# Patient Record
Sex: Female | Born: 1950 | Race: Black or African American | Hispanic: No | Marital: Married | Smoking: Never smoker
Health system: Southern US, Community
[De-identification: ages and names within clinical notes are randomized; demographics above are authoritative.]

## PROBLEM LIST (undated history)

## (undated) DIAGNOSIS — Z8669 Personal history of other diseases of the nervous system and sense organs: Secondary | ICD-10-CM

## (undated) DIAGNOSIS — N879 Dysplasia of cervix uteri, unspecified: Secondary | ICD-10-CM

## (undated) DIAGNOSIS — E785 Hyperlipidemia, unspecified: Secondary | ICD-10-CM

## (undated) HISTORY — PX: CHOLECYSTECTOMY: SHX55

## (undated) HISTORY — PX: ABDOMINAL HYSTERECTOMY: SHX81

## (undated) HISTORY — DX: Hyperlipidemia, unspecified: E78.5

## (undated) HISTORY — DX: Personal history of other diseases of the nervous system and sense organs: Z86.69

## (undated) HISTORY — PX: COLONOSCOPY: SHX174

## (undated) HISTORY — DX: Dysplasia of cervix uteri, unspecified: N87.9

---

## 1998-04-30 ENCOUNTER — Other Ambulatory Visit: Admission: RE | Admit: 1998-04-30 | Discharge: 1998-04-30 | Payer: Self-pay | Admitting: Obstetrics and Gynecology

## 1999-01-08 ENCOUNTER — Ambulatory Visit (HOSPITAL_COMMUNITY): Admission: RE | Admit: 1999-01-08 | Discharge: 1999-01-08 | Payer: Self-pay | Admitting: Obstetrics and Gynecology

## 2001-03-07 ENCOUNTER — Observation Stay (HOSPITAL_COMMUNITY): Admission: RE | Admit: 2001-03-07 | Discharge: 2001-03-08 | Payer: Self-pay | Admitting: *Deleted

## 2001-03-07 ENCOUNTER — Encounter (INDEPENDENT_AMBULATORY_CARE_PROVIDER_SITE_OTHER): Payer: Self-pay | Admitting: Specialist

## 2002-02-28 ENCOUNTER — Other Ambulatory Visit: Admission: RE | Admit: 2002-02-28 | Discharge: 2002-02-28 | Payer: Self-pay | Admitting: Obstetrics and Gynecology

## 2002-04-07 ENCOUNTER — Emergency Department (HOSPITAL_COMMUNITY): Admission: EM | Admit: 2002-04-07 | Discharge: 2002-04-07 | Payer: Self-pay | Admitting: *Deleted

## 2003-04-30 ENCOUNTER — Other Ambulatory Visit: Admission: RE | Admit: 2003-04-30 | Discharge: 2003-04-30 | Payer: Self-pay | Admitting: Obstetrics and Gynecology

## 2005-05-20 ENCOUNTER — Encounter: Admission: RE | Admit: 2005-05-20 | Discharge: 2005-05-20 | Payer: Self-pay | Admitting: Internal Medicine

## 2006-07-19 ENCOUNTER — Ambulatory Visit: Payer: Self-pay | Admitting: Internal Medicine

## 2006-08-04 ENCOUNTER — Ambulatory Visit: Payer: Self-pay | Admitting: Internal Medicine

## 2008-12-22 ENCOUNTER — Ambulatory Visit: Payer: Self-pay | Admitting: Internal Medicine

## 2009-05-22 ENCOUNTER — Ambulatory Visit: Payer: Self-pay | Admitting: Internal Medicine

## 2009-06-04 ENCOUNTER — Ambulatory Visit: Payer: Self-pay | Admitting: Internal Medicine

## 2009-09-18 ENCOUNTER — Ambulatory Visit: Payer: Self-pay | Admitting: Internal Medicine

## 2010-03-19 ENCOUNTER — Ambulatory Visit: Payer: Self-pay | Admitting: Internal Medicine

## 2010-09-24 ENCOUNTER — Ambulatory Visit
Admission: RE | Admit: 2010-09-24 | Discharge: 2010-09-24 | Payer: Self-pay | Source: Home / Self Care | Attending: Internal Medicine | Admitting: Internal Medicine

## 2011-01-21 NOTE — Op Note (Signed)
[facility]  Patient:    Carla Collins, Carla Collins                      MRN: [id number] Proc. Date: 03/07/01 Adm. Date:  [id number] Attending:  Morene Antu                           Operative Report  PREOPERATIVE DIAGNOSES:       1. Fibroid uterus.                               2. Menorrhagia.  POSTOPERATIVE DIAGNOSES:      1. Fibroid uterus.                               2. Menorrhagia.  OPERATION:                    Laparoscopic-assisted vaginal hysterectomy.  SURGEON:                      Sherry A. Rosalio Macadamia, M.D.  ASSISTANT:                    Sung Amabile. Roslyn Smiling, M.D.  ANESTHESIA:                   General.  INDICATIONS:                  This is a 60 year old G2, P2-0-0-2, woman who has menstrual periods every month lasting 7 days.  The patient has excessively heavy bleeding, changing an overnight pad every hour with large clots.  The patient had a D&C with resectoscopic excision of submucosal fibroid in 2000; however, her bleeding improved initially but then became excessively heavy. Because of this, the patient is interested in a hysterectomy.  The patient requests that her ovaries be left in place if they are normal.  FINDINGS:                     A 12-week size uterus with multiple fibroids presents and normal tubes and ovaries.  DESCRIPTION OF PROCEDURE:     The patient was brought into the operating room and given adequate general anesthesia.  She was placed in the dorsolithotomy position.  Her abdomen and vagina were washed with Betadine.  A Foley catheter was inserted into the bladder.  The patient was draped in a sterile fashion. The subumbilical area was infiltrated with 0.25% Marcaine.  incision was made, and the fascia identified.  This was grasped with Kochers.  It was incised. Using 0 Vicryl, stay stitches were taken on either side of the fascia.  The the peritoneum identified and opened.  A Hasson trocar sleeve was placed  in the peritoneal cavity, and this was stitch down with a 0 Vicryl stay suture. Carbon dioxide was insufflated.  The laparoscope was introduced into the peritoneal cavity.  The pelvis was inspected.  It was felt an LAVH could probably be performed.  Using 1% Marcaine initially, two incisions were made mid abdomen in the lateral areas under direct visualization.  Trocars were placed within the space under direct visualization.  A small puncture of the fundal fibroid was made in placing the left trocar.  Using Kleppingers, this bleeding area was cauterized for hemostasis.  The left round ligament, using the Ligasure, was cauterized.  The left utero-ovarian ligaments were cauterized and cut using the Ligasure, performed by doing a double or triple burn prior to cutting.  The same procedure was then performed on the right. The ureters had been identified well below the area of surgery.  The cautery was taken just to above the uterine arteries.  The bladder peritoneum was identified and picked up. A small incision was made.  using the Nezhat, this area was hydroinsufflated.  The peritoneal tissues were then incised across the lower uterine segment, and the bladder was dissected down slightly with blunt dissection.  Irrigant fluid was suctioned, and the gas was turned off. The laparoscope was removed to prepare for the vaginal portion of the case.  A weighted speculum was placed in the vagina.  The cervix was grasped with two [place] tenaculums.  The cervix was infiltrated with 1% Nesacaine and 1:100,000 epinephrine.  The cervix was circumcised and vaginal tissue dissected off the cervix.  The posterior peritoneum was identified and entered sharply.  This incision was extended laterally bluntly.  A stay suture was placed between the peritoneum and vaginal mucosa for identification purposes. The uterosacral ligaments were clamped, cut, and suture ligated with 0 Vicryl ligatures.  The bladder was  developed off the lower uterine segment. Beginning cardinal ligaments were clamped and cauterized with the Ligasure and cut.  Some of these tissues were difficult to grasp with the Ligasure because they were so thin the popped out of the Ligasure.  Therefore, Heaney clamps were used.  Cardinal ligaments were clamped, cut, and suture ligated with 0 Vicryl ligatures.  Once the anterior peritoneal incision was found such that we were well below the bladder, the cardinal ligaments and uterine arteries were clamped, cut, and suture ligated with 0 Vicryl.  Some of these were cauterized with the Ligasure and cut depending on accessibility.  The last attachment of the left sidewall was identified.  It was cut after cauterizing it.  The cervix was then cut using a long knife and wedged in the middle of the uterus to remove some of the uterine tissue.  Two myomas had to be dissected free from the uterus.  The left side of the uterus was able to then be delivered using towel clips.  The right side of the uterus had a very small attachment which was clamped, cut, and cauterized.  The remainder of the uterus was able to be removed.  Some small bleeders were present.  Some of the vaginal cuff was found to be bleeding mildly.  These areas were closed with 0 Vicryl figure-of-eight stitches.  The posterior cuff was closed with 0 Vicryl in figure-of-eight stitches.  A peritoneal stitch was placed but not tied at this time.  A posterior McCall stitch was placed by taking 0 Vicryl from the uterosacral ligament across the posterior peritoneum to the right uterosacral ligament.  This was tied.  The peritoneal stitch was then tied.  Using the end of the peritoneal stitch, it was brought out inside the uterosacral ligaments just beneath the vaginal cuff.  Using a 3 needle, the same stitch was taken on the right.  These stitches were tied beneath the vaginal cuff in the midline. The vaginal cuff was then closed with  0 Vicryl figure-of-eight stitches. Adequate hemostasis was present, and attention was brought back up to the abdominal part of the procedure.  Surgeons gloves were changed, and the  patient was appropriately redraped for  this part of the procedure.  Carbon dioxide was again insufflated.  The laparoscope was replaced.  The pelvis was inspected.  There was very minimal bleeding.  These bleeding areas were cauterized using Kleppingers.  Incision was irrigated with the Nezhat and suctioned.  Pictures were obtained.  All of the fluid was attempted to be removed.  The vaginal cuff and pedicles were all inspected and felt to have adequate hemostasis.  All carbon dioxide was then allowed to escape.  The lower sleeves were removed under direct visualization.  The Hasson sleeve was removed after all carbon dioxide had escaped.  The subumbilical incision was closed with the stay sutures as well as closing in between with a running locked stitch.  The subcutaneous tissue was closed with 0 Vicryl mattress stitch including the Scarpas fascia.   The incisions were infiltrated with 0.25% Marcaine again.  The lower incisions were inspected.  The fascial incision was so small and so deep, it was unable to be closed in the lower incisions.  The skin incisions were then closed with 4-0 Maxon in subcuticular stitches.  Adequate hemostasis was present.  Band-Aids were placed over the wounds.  The patient was then taken out of the dorsolithotomy position.  She was awakened.  She was extubated.  She was moved from the operating table to a stretcher in stable condition.  Complications were none.  Estimated blood loss was less than 100 cc. DD:  03/07/01 TD:  03/07/01 Job: [id number]

## 2011-03-17 ENCOUNTER — Other Ambulatory Visit: Payer: 59 | Admitting: Internal Medicine

## 2011-03-17 DIAGNOSIS — E785 Hyperlipidemia, unspecified: Secondary | ICD-10-CM

## 2011-03-17 LAB — LIPID PANEL
Cholesterol: 222 mg/dL — ABNORMAL HIGH (ref 0–200)
HDL: 62 mg/dL (ref >39)
LDL Cholesterol: 145 mg/dL — ABNORMAL HIGH (ref 0–99)
Total CHOL/HDL Ratio: 3.6 Ratio
Triglycerides: 73 mg/dL (ref <150)
VLDL: 15 mg/dL (ref 0–40)

## 2011-03-17 LAB — HEPATIC FUNCTION PANEL
ALT: 16 U/L (ref 0–35)
AST: 23 U/L (ref 0–37)
Albumin: 4 g/dL (ref 3.5–5.2)
Alkaline Phosphatase: 85 U/L (ref 39–117)
Bilirubin, Direct: 0.1 mg/dL (ref 0.0–0.3)
Indirect Bilirubin: 0.5 mg/dL (ref 0.0–0.9)
Total Protein: 7.2 g/dL (ref 6.0–8.3)

## 2011-03-18 ENCOUNTER — Ambulatory Visit: Payer: Self-pay | Admitting: Internal Medicine

## 2011-03-18 LAB — VITAMIN D 25 HYDROXY (VIT D DEFICIENCY, FRACTURES): Vit D, 25-Hydroxy: 20 ng/mL — ABNORMAL LOW (ref 30–89)

## 2011-03-29 ENCOUNTER — Ambulatory Visit (INDEPENDENT_AMBULATORY_CARE_PROVIDER_SITE_OTHER): Payer: 59 | Admitting: Internal Medicine

## 2011-03-29 ENCOUNTER — Encounter: Payer: Self-pay | Admitting: Internal Medicine

## 2011-03-29 DIAGNOSIS — E559 Vitamin D deficiency, unspecified: Secondary | ICD-10-CM

## 2011-03-29 DIAGNOSIS — E785 Hyperlipidemia, unspecified: Secondary | ICD-10-CM

## 2011-03-29 NOTE — Progress Notes (Signed)
  Subjective:    Patient ID: Carla Collins, female    DOB: 09/01/1951, 60 y.o.   MRN: [id number]  HPI patient in today for six-month recheck on hyperlipidemia on statin therapy consisting of Zocor 40 mg daily. Also followup on vitamin D deficiency. Patient currently taking 1000 units vitamin D 3 daily. 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 Not able to exercise very much. In January total cholesterol was 173, HDL cholesterol 61 and LDL cholesterol was 100. TSH was normal at the time. Liver functions were normal.    Review of Systems     Objective:   Physical Exam neck: no thyromegaly, no carotid bruits; chest clear; cardiac exam: regular rate and rhythm normal S1 and S2; extremities without edema        Assessment & Plan:  Hyperlipidemia  Vitamin D deficiency  Plan: Continue with generic Zocor 40 mg daily. Encouraged diet exercise and weight loss. She's been traveling a great deal over the past few months and probably hasn't given dietary restriction a good trial. We are not increase the dose of Zocor this point in time despite worsening of total cholesterol and LDL. Vitamin D level is low at 20. Prescribed Drisdol 50,000 units weekly for 12 weeks followed by 2000 units vitamin D 3 daily by mouth. Schedule physical examination early February 2013

## 2011-03-29 NOTE — Patient Instructions (Signed)
Continue diet exercise and weight loss efforts. Continue same dose of Zocor 40 mg daily. Take vitamin D 50,000 units weekly for 12 weeks followed by 2000 units vitamin D 3 daily over-the-counter. See in 6 months

## 2011-10-10 ENCOUNTER — Other Ambulatory Visit: Payer: 59 | Admitting: Internal Medicine

## 2011-10-10 DIAGNOSIS — Z0000 Encounter for general adult medical examination without abnormal findings: Secondary | ICD-10-CM

## 2011-10-10 LAB — COMPREHENSIVE METABOLIC PANEL
ALT: 21 U/L (ref 0–35)
Albumin: 4 g/dL (ref 3.5–5.2)
CO2: 23 mEq/L (ref 19–32)
Calcium: 9.3 mg/dL (ref 8.4–10.5)
Chloride: 109 mEq/L (ref 96–112)
Glucose, Bld: 87 mg/dL (ref 70–99)
Potassium: 4.5 mEq/L (ref 3.5–5.3)
Sodium: 143 mEq/L (ref 135–145)
Total Bilirubin: 0.5 mg/dL (ref 0.3–1.2)
Total Protein: 7.2 g/dL (ref 6.0–8.3)

## 2011-10-10 LAB — LIPID PANEL
Cholesterol: 169 mg/dL (ref 0–200)
Triglycerides: 58 mg/dL (ref <150)
VLDL: 12 mg/dL (ref 0–40)

## 2011-10-11 ENCOUNTER — Encounter: Payer: Self-pay | Admitting: Internal Medicine

## 2011-10-11 ENCOUNTER — Ambulatory Visit (INDEPENDENT_AMBULATORY_CARE_PROVIDER_SITE_OTHER): Payer: 59 | Admitting: Internal Medicine

## 2011-10-11 VITALS — BP 108/84 | HR 84 | Temp 97.0°F | Ht 65.5 in | Wt 178.0 lb

## 2011-10-11 DIAGNOSIS — Z0000 Encounter for general adult medical examination without abnormal findings: Secondary | ICD-10-CM

## 2011-10-11 DIAGNOSIS — Z8669 Personal history of other diseases of the nervous system and sense organs: Secondary | ICD-10-CM

## 2011-10-11 DIAGNOSIS — Z87440 Personal history of urinary (tract) infections: Secondary | ICD-10-CM

## 2011-10-11 LAB — CBC WITH DIFFERENTIAL/PLATELET
Eosinophils Absolute: 0.1 10*3/uL (ref 0.0–0.7)
Hemoglobin: 14.1 g/dL (ref 12.0–15.0)
Lymphocytes Relative: 52 % — ABNORMAL HIGH (ref 12–46)
Lymphs Abs: 3.5 10*3/uL (ref 0.7–4.0)
MCH: 30.5 pg (ref 26.0–34.0)
MCV: 92.2 fL (ref 78.0–100.0)
Monocytes Relative: 6 % (ref 3–12)
Neutrophils Relative %: 41 % — ABNORMAL LOW (ref 43–77)
Platelets: 291 10*3/uL (ref 150–400)
RBC: 4.63 MIL/uL (ref 3.87–5.11)
WBC: 6.7 10*3/uL (ref 4.0–10.5)

## 2011-10-11 LAB — POCT URINALYSIS DIPSTICK
Bilirubin, UA: NEGATIVE
Glucose, UA: NEGATIVE
Nitrite, UA: NEGATIVE
Urobilinogen, UA: NEGATIVE

## 2011-10-11 LAB — VITAMIN D 25 HYDROXY (VIT D DEFICIENCY, FRACTURES): Vit D, 25-Hydroxy: 25 ng/mL — ABNORMAL LOW (ref 30–89)

## 2011-10-11 LAB — PATHOLOGIST SMEAR REVIEW

## 2011-10-24 ENCOUNTER — Ambulatory Visit (INDEPENDENT_AMBULATORY_CARE_PROVIDER_SITE_OTHER): Payer: 59 | Admitting: Internal Medicine

## 2011-10-24 ENCOUNTER — Encounter: Payer: Self-pay | Admitting: Internal Medicine

## 2011-10-24 VITALS — BP 106/76 | HR 84 | Temp 97.9°F | Wt 178.0 lb

## 2011-10-24 DIAGNOSIS — H6690 Otitis media, unspecified, unspecified ear: Secondary | ICD-10-CM

## 2011-10-24 DIAGNOSIS — J069 Acute upper respiratory infection, unspecified: Secondary | ICD-10-CM

## 2011-10-24 DIAGNOSIS — H6692 Otitis media, unspecified, left ear: Secondary | ICD-10-CM

## 2011-10-24 NOTE — Patient Instructions (Signed)
Take Biaxin 500 mg twice daily with meals for 10 days. Take Tessalon Perles 2 by mouth 3 times daily as needed for cough. Drink plenty of fluids and get plenty of rest. Call if not better in one week.

## 2011-10-24 NOTE — Progress Notes (Signed)
  Subjective:    Patient ID: Carla Collins, female    DOB: Apr 25, 1951, 61 y.o.   MRN: [id number]  HPI patient in today complaining of URI symptoms and had onset on February 13. Came down with runny nose, scratchy throat and subsequently developed ear pain over the weekend. Specifically has acute pain left ear. No fever or shaking chills. Cannot hear well out of a year.    Review of Systems     Objective:   Physical Exam HEENT exam: Patient has fullness right TM with good light reflex. Left TM is fiery red and dull. Pharynx slightly injected. Neck is supple without significant adenopathy. Chest clear.        Assessment & Plan:  URI  Acute left otitis media  Plan: Biaxin 500 mg by mouth twice daily for 10 days. Tessalon Perles 100 mg (#D.) 2 by mouth 3 times a day when necessary cough.

## 2011-11-07 ENCOUNTER — Telehealth: Payer: Self-pay | Admitting: Internal Medicine

## 2011-11-07 MED ORDER — HYDROCODONE-HOMATROPINE 5-1.5 MG/5ML PO SYRP: 5.0000 mL | ORAL_SOLUTION | Freq: Three times a day (TID) | ORAL | Status: AC | PRN

## 2011-11-07 NOTE — Telephone Encounter (Signed)
Call in Hycodan Syrup 8 oz  One tsp every 6 hors as needed for cough

## 2011-12-05 DIAGNOSIS — Z8669 Personal history of other diseases of the nervous system and sense organs: Secondary | ICD-10-CM | POA: Insufficient documentation

## 2011-12-05 DIAGNOSIS — Z87440 Personal history of urinary (tract) infections: Secondary | ICD-10-CM | POA: Insufficient documentation

## 2011-12-05 NOTE — Progress Notes (Signed)
  Subjective:    Patient ID: Carla Collins, female    DOB: 1950/09/19, 61 y.o.   MRN: [id number]  HPI pleasant 61 year old black female with history of hyperlipidemia for health maintenance exam. History of migraine headaches treated by Dr. Marcelyn Ditty and 1989. History of cervical dysplasia 1992 with cryotherapy. History of urticaria 2008. History of vitamin D deficiency. Sulfa causes a rash. Has had recurrent urinary tract infections from time to time. Hyperlipidemia well controlled with simvastatin 40 mg daily. Patient had a D&C in 2000. Has had a hysterectomy. Had colonoscopy 2007 by Dr. Juanda Chance with 10 year followup recommended.  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 She is married. Has a Masters degree. Does not smoke or consume alcohol. Has one daughter.  Family history: Father with history of MI. Mother with history of hypertension. One sister alive and well.  GYN is [employer]    Review of Systems noncontributory      Objective:   Physical Exam HEENT exam: TMs and pharynx are clear; extraocular movements are full; pharynx is clear; neck is supple without JVD thyromegaly or carotid bruits. Chest is clear to auscultation. Breasts normal female without masses. Cardiac exam regular rate and rhythm normal S1 and S2. Abdomen no hepatosplenomegaly masses or tenderness. GYN exam is deferred to GYN physician. Extremities without deformity or edema. Neuro no gross focal deficits on brief neurological exam.        Assessment & Plan:  Hyperlipidemia  History of vitamin D deficiency   History of recurrent urinary tract infections  Remote history of migraine headaches  Plan: Return in 6 months for office visit lipid panel liver functions. Encouraged diet and exercise.

## 2011-12-05 NOTE — Patient Instructions (Signed)
Continue same medications and return in 6 months 

## 2012-01-19 ENCOUNTER — Other Ambulatory Visit: Payer: Self-pay | Admitting: Internal Medicine

## 2012-01-20 ENCOUNTER — Other Ambulatory Visit: Payer: Self-pay

## 2012-01-20 MED ORDER — SIMVASTATIN 40 MG PO TABS: 40.0000 mg | ORAL_TABLET | Freq: Every day | ORAL | Status: DC

## 2012-01-26 ENCOUNTER — Ambulatory Visit (INDEPENDENT_AMBULATORY_CARE_PROVIDER_SITE_OTHER): Payer: 59 | Admitting: Internal Medicine

## 2012-01-26 ENCOUNTER — Encounter: Payer: Self-pay | Admitting: Internal Medicine

## 2012-01-26 VITALS — BP 120/84 | HR 84 | Temp 96.4°F | Wt 182.0 lb

## 2012-01-26 DIAGNOSIS — E785 Hyperlipidemia, unspecified: Secondary | ICD-10-CM

## 2012-01-26 DIAGNOSIS — N3941 Urge incontinence: Secondary | ICD-10-CM

## 2012-01-26 DIAGNOSIS — N393 Stress incontinence (female) (male): Secondary | ICD-10-CM

## 2012-01-26 DIAGNOSIS — J069 Acute upper respiratory infection, unspecified: Secondary | ICD-10-CM

## 2012-01-26 DIAGNOSIS — K529 Noninfective gastroenteritis and colitis, unspecified: Secondary | ICD-10-CM

## 2012-01-26 DIAGNOSIS — K5289 Other specified noninfective gastroenteritis and colitis: Secondary | ICD-10-CM

## 2012-01-26 NOTE — Patient Instructions (Signed)
Take Levaquin 500 milligrams daily for 7 days. Hycodan 8 ounces prescribed to take 1 teaspoon every 6 hours as needed for cough. Try Toviaz 4 mg daily for urinary incontinence issues

## 2012-01-26 NOTE — Progress Notes (Signed)
  Subjective:    Patient ID: Carla Collins, female    DOB: 1951/01/09, 61 y.o.   MRN: [id number]  HPI Pleasant 61 year old black female with history of hyperlipidemia on lipid lowering medication in today with URI symptoms. On Monday, May 20 she had a 24-hour bout of gastroenteritis with vomiting and diarrhea. That resolved and she subsequently developed a respiratory infection. Has been coughing a great deal. Cough sounds somewhat congested. No fever or shaking chills. Cough history GERD stress urinary incontinence. This is been very disconcerting to her. Also has had some urge urinary incontinence in the past. No sore throat- just cough and sinus congestion.    Review of Systems     Objective:   Physical Exam HEENT exam: TMs are clear; she sounds nasally congested when she speaks; chest clear to auscultation; abdomen is benign.        Assessment & Plan:  Upper respiratory infection  Gastroenteritis-resolved  Stress urinary incontinence  History of urge urinary incontinence  Hyperlipidemia  Plan: Samples of Toviaz 4 mg daily with prescription for same with refills. Can increase to 8 mg if necessary. Levaquin 500 milligrams daily for 7 days for respiratory infection. Hycodan 8 ounces 1 teaspoon by mouth Q6 hours when necessary cough

## 2012-04-10 ENCOUNTER — Other Ambulatory Visit: Payer: 59 | Admitting: Internal Medicine

## 2012-04-12 ENCOUNTER — Ambulatory Visit (INDEPENDENT_AMBULATORY_CARE_PROVIDER_SITE_OTHER): Payer: 59 | Admitting: Internal Medicine

## 2012-04-12 ENCOUNTER — Encounter: Payer: Self-pay | Admitting: Internal Medicine

## 2012-04-12 ENCOUNTER — Other Ambulatory Visit: Payer: 59 | Admitting: Internal Medicine

## 2012-04-12 VITALS — BP 102/78 | HR 76 | Temp 97.6°F | Wt 183.5 lb

## 2012-04-12 DIAGNOSIS — E785 Hyperlipidemia, unspecified: Secondary | ICD-10-CM

## 2012-04-12 DIAGNOSIS — E669 Obesity, unspecified: Secondary | ICD-10-CM

## 2012-04-12 DIAGNOSIS — Z79899 Other long term (current) drug therapy: Secondary | ICD-10-CM

## 2012-04-12 LAB — LIPID PANEL
HDL: 63 mg/dL (ref >39)
LDL Cholesterol: 122 mg/dL — ABNORMAL HIGH (ref 0–99)
Triglycerides: 70 mg/dL (ref <150)

## 2012-04-12 LAB — HEPATIC FUNCTION PANEL
AST: 19 U/L (ref 0–37)
Albumin: 3.9 g/dL (ref 3.5–5.2)
Total Bilirubin: 0.6 mg/dL (ref 0.3–1.2)
Total Protein: 7.4 g/dL (ref 6.0–8.3)

## 2012-04-12 NOTE — Patient Instructions (Addendum)
Continue same meds and return in 6 months. Watch diet and lose weight.

## 2012-05-07 NOTE — Progress Notes (Signed)
  Subjective:    Patient ID: Carla Collins, female    DOB: [birth year], 61 y.o.   MRN: [id number]  HPI 61 year old black female in today for followup on hyperlipidemia. This is a six-month recheck. No complaints or problems. Fasting lipid panel and liver functions drawn recently. Total cholesterol 199 with an LDL cholesterol of 122 despite being on Zocor 40 mg daily. A year ago total cholesterol was 222. Patient doesn't really exercise. She works for [employer] and travels a lot and is in a lot of meetings sitting. She is overweight and needs to lose some weight and follow a more strict low-fat diet.    Review of Systems     Objective:   Physical Exam neck is supple without JVD thyromegaly or carotid bruits; chest clear to auscultation; cardiac exam regular rate and rhythm normal S1 and S2. Extremities without edema, skin is warm and dry.        Assessment & Plan:  Hyperlipidemia  Obesity  Plan: Patient encouraged to try harder with diet exercise and weight loss plan. Reassess in 6 months at which time she'll be due for physical exam.

## 2012-06-25 ENCOUNTER — Other Ambulatory Visit: Payer: Self-pay

## 2012-06-25 MED ORDER — SIMVASTATIN 40 MG PO TABS: 40.0000 mg | ORAL_TABLET | Freq: Every day | ORAL | Status: DC

## 2012-11-05 ENCOUNTER — Other Ambulatory Visit: Payer: 59 | Admitting: Internal Medicine

## 2012-11-05 DIAGNOSIS — E559 Vitamin D deficiency, unspecified: Secondary | ICD-10-CM

## 2012-11-05 DIAGNOSIS — E785 Hyperlipidemia, unspecified: Secondary | ICD-10-CM

## 2012-11-05 DIAGNOSIS — Z0000 Encounter for general adult medical examination without abnormal findings: Secondary | ICD-10-CM

## 2012-11-05 LAB — COMPREHENSIVE METABOLIC PANEL
ALT: 13 U/L (ref 0–35)
AST: 19 U/L (ref 0–37)
Albumin: 4.1 g/dL (ref 3.5–5.2)
Alkaline Phosphatase: 72 U/L (ref 39–117)
BUN: 14 mg/dL (ref 6–23)
Calcium: 9.5 mg/dL (ref 8.4–10.5)
Chloride: 107 mEq/L (ref 96–112)
Potassium: 4.2 mEq/L (ref 3.5–5.3)
Sodium: 139 mEq/L (ref 135–145)
Total Protein: 7.3 g/dL (ref 6.0–8.3)

## 2012-11-05 LAB — CBC WITH DIFFERENTIAL/PLATELET
Basophils Absolute: 0 10*3/uL (ref 0.0–0.1)
Basophils Relative: 1 % (ref 0–1)
HCT: 41.9 % (ref 36.0–46.0)
Hemoglobin: 14.3 g/dL (ref 12.0–15.0)
Lymphocytes Relative: 57 % — ABNORMAL HIGH (ref 12–46)
Monocytes Absolute: 0.4 10*3/uL (ref 0.1–1.0)
Neutro Abs: 2 10*3/uL (ref 1.7–7.7)
Neutrophils Relative %: 34 % — ABNORMAL LOW (ref 43–77)
RDW: 14.2 % (ref 11.5–15.5)
WBC: 5.7 10*3/uL (ref 4.0–10.5)

## 2012-11-05 LAB — LIPID PANEL: LDL Cholesterol: 190 mg/dL — ABNORMAL HIGH (ref 0–99)

## 2012-11-05 LAB — TSH: TSH: 3.736 u[IU]/mL (ref 0.350–4.500)

## 2012-11-06 ENCOUNTER — Encounter: Payer: Self-pay | Admitting: Internal Medicine

## 2012-11-06 ENCOUNTER — Encounter: Payer: 59 | Admitting: Internal Medicine

## 2012-11-06 LAB — VITAMIN D 25 HYDROXY (VIT D DEFICIENCY, FRACTURES): Vit D, 25-Hydroxy: 26 ng/mL — ABNORMAL LOW (ref 30–89)

## 2012-12-18 ENCOUNTER — Encounter: Payer: 59 | Admitting: Internal Medicine

## 2013-02-11 ENCOUNTER — Other Ambulatory Visit: Payer: Self-pay | Admitting: Internal Medicine

## 2013-02-11 ENCOUNTER — Ambulatory Visit (INDEPENDENT_AMBULATORY_CARE_PROVIDER_SITE_OTHER): Payer: 59 | Admitting: Internal Medicine

## 2013-02-11 ENCOUNTER — Encounter: Payer: Self-pay | Admitting: Internal Medicine

## 2013-02-11 VITALS — BP 122/84 | HR 84 | Temp 96.8°F | Wt 186.0 lb

## 2013-02-11 DIAGNOSIS — E559 Vitamin D deficiency, unspecified: Secondary | ICD-10-CM

## 2013-02-11 DIAGNOSIS — Z789 Other specified health status: Secondary | ICD-10-CM | POA: Insufficient documentation

## 2013-02-11 DIAGNOSIS — N390 Urinary tract infection, site not specified: Secondary | ICD-10-CM

## 2013-02-11 DIAGNOSIS — E785 Hyperlipidemia, unspecified: Secondary | ICD-10-CM

## 2013-02-11 DIAGNOSIS — M722 Plantar fascial fibromatosis: Secondary | ICD-10-CM

## 2013-02-11 DIAGNOSIS — Z803 Family history of malignant neoplasm of breast: Secondary | ICD-10-CM

## 2013-02-11 NOTE — Progress Notes (Signed)
Subjective:    Patient ID: Carla Collins, female    DOB: 1951-05-30, 62 y.o.   MRN: [id number]  HPI 62 year old Black female in today for health maintenance and evaluation of medical problems. History of vitamin D deficiency and hyperlipidemia. Mother was recently diagnosed with breast cancer at age 64. She has lots of questions about this. Several of mother sisters also have breast cancer. Mother will be seeing oncologist soon and patient should go with mother to that visit and discuss whether or not she needs to be tested for BRCA gene. Fasting labs were reviewed. She is off simvastatin because she was having myalgias with it. She also now has persistent pain in her feet which she relates to the statins but she's been off statins for several months. I do think she probably has plantar fasciitis because of complaint of heel pain. Recommended she see podiatrist. Cholesterol is markedly elevated off statin medication. Specifically elevated his total cholesterol in the 250 range and LDL cholesterol. TSH is normal. Vitamin D level is low and she's only taking 1000 units vitamin D 3 daily. Needs to increase that to 2000 units daily.  Aside from mother being recently diagnosed with breast cancer at age 61, there is no change in family history. Father with history of MI. Mother with history of hypertension. One sister alive and well.  Social history: Patient is married. 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 Does not smoke or consume alcohol. Has one daughter.  History migraine headaches. History of cervical dysplasia 1992 with cryotherapy. History of vitamin D deficiency. History of urticaria 2008. Has had recurrent urinary tract infections from time to time. Patient had D&C in 2000. Has had hysterectomy. Had colonoscopy in 2007 with 10 year followup recommended.  [facility] dose GYN exam.      Review of Systems  Constitutional: Negative.   Eyes: Negative.    Respiratory: Negative.   Cardiovascular: Negative.   Gastrointestinal: Negative.   Endocrine: Negative.   Genitourinary: Negative.   Allergic/Immunologic: Negative.   Neurological: Negative.   Hematological: Negative.   Psychiatric/Behavioral: Negative.        Objective:   Physical Exam  Constitutional: She is oriented to person, place, and time. She appears well-developed and well-nourished. No distress.  HENT:  Head: Normocephalic and atraumatic.  Right Ear: External ear normal.  Left Ear: External ear normal.  Mouth/Throat: No oropharyngeal exudate.  Eyes: Conjunctivae and EOM are normal. Pupils are equal, round, and reactive to light. Right eye exhibits no discharge. Left eye exhibits no discharge. No scleral icterus.  Neck: Neck supple. No JVD present. No thyromegaly present.  Cardiovascular: Normal rate, regular rhythm, normal heart sounds and intact distal pulses.   No murmur heard. Regular rate and rhythm normal S1 and S2 without murmur  Pulmonary/Chest: Effort normal and breath sounds normal. No respiratory distress. She has no wheezes. She has no rales.  Breasts normal female without masses  Abdominal: Soft. Bowel sounds are normal. She exhibits no distension and no mass. There is no tenderness. There is no rebound and no guarding.  Genitourinary:  Deferred to GYN  Musculoskeletal: She exhibits no edema.  Lymphadenopathy:    She has no cervical adenopathy.  Neurological: She is alert and oriented to person, place, and time. She has normal reflexes. No cranial nerve deficit. Coordination normal.  Skin: Skin is dry. No rash noted. She is not diaphoretic. No erythema.  Psychiatric: She has a normal mood  and affect. Her behavior is normal. Judgment and thought content normal.          Assessment & Plan:  Plantar fasciitis-treated with anti-inflammatory medication. Shown foot exercises to stretch fascia  Myalgias on simvastatin-try Crestor 10 mg daily. Return in 3  months for lipid panel, liver functions without office visit if tolerated  Urinary tract infection-treat with Cipro 500 mg twice daily for 7 days. Culture is pending  Hyperlipidemia-start Crestor 10 mg daily and return in 3 months for lipid panel liver functions without office visit if tolerated  Family history of breast cancer

## 2013-02-11 NOTE — Patient Instructions (Addendum)
Discontinue simvastatin. Try Crestor 10 mg daily. Return in 3 months for lipid panel and liver functions with office visit. For urinary tract infection take Cipro 500 mg twice daily for 7 days. Urine culture is pending. Take vitamin D 2000 units daily instead of 1000 units daily. Discussed genetic testing for breast cancer with oncologist.

## 2013-02-13 LAB — URINE CULTURE: Colony Count: 60000

## 2013-06-14 ENCOUNTER — Other Ambulatory Visit: Payer: Self-pay | Admitting: Internal Medicine

## 2013-08-13 ENCOUNTER — Other Ambulatory Visit: Payer: 59 | Admitting: Internal Medicine

## 2013-08-13 DIAGNOSIS — Z79899 Other long term (current) drug therapy: Secondary | ICD-10-CM

## 2013-08-13 DIAGNOSIS — E785 Hyperlipidemia, unspecified: Secondary | ICD-10-CM

## 2013-08-13 LAB — HEPATIC FUNCTION PANEL
ALT: 21 U/L (ref 0–35)
Alkaline Phosphatase: 95 U/L (ref 39–117)
Bilirubin, Direct: 0.1 mg/dL (ref 0.0–0.3)
Indirect Bilirubin: 0.5 mg/dL (ref 0.0–0.9)

## 2013-08-13 LAB — LIPID PANEL
LDL Cholesterol: 99 mg/dL (ref 0–99)
Triglycerides: 57 mg/dL (ref <150)
VLDL: 11 mg/dL (ref 0–40)

## 2013-08-15 ENCOUNTER — Telehealth: Payer: Self-pay | Admitting: Internal Medicine

## 2013-08-15 NOTE — Telephone Encounter (Signed)
Noted  

## 2013-08-19 ENCOUNTER — Other Ambulatory Visit: Payer: Self-pay | Admitting: *Deleted

## 2013-08-20 ENCOUNTER — Other Ambulatory Visit: Payer: Self-pay | Admitting: *Deleted

## 2013-12-30 ENCOUNTER — Encounter: Payer: Self-pay | Admitting: Internal Medicine

## 2013-12-30 ENCOUNTER — Ambulatory Visit (INDEPENDENT_AMBULATORY_CARE_PROVIDER_SITE_OTHER): Payer: 59 | Admitting: Internal Medicine

## 2013-12-30 VITALS — BP 118/90 | HR 84 | Temp 98.4°F | Wt 193.0 lb

## 2013-12-30 DIAGNOSIS — J069 Acute upper respiratory infection, unspecified: Secondary | ICD-10-CM

## 2013-12-30 DIAGNOSIS — H6593 Unspecified nonsuppurative otitis media, bilateral: Secondary | ICD-10-CM

## 2013-12-30 DIAGNOSIS — G4700 Insomnia, unspecified: Secondary | ICD-10-CM

## 2013-12-30 DIAGNOSIS — H6590 Unspecified nonsuppurative otitis media, unspecified ear: Secondary | ICD-10-CM

## 2013-12-30 DIAGNOSIS — R635 Abnormal weight gain: Secondary | ICD-10-CM

## 2013-12-30 MED ORDER — AZITHROMYCIN 250 MG PO TABS: ORAL_TABLET | ORAL | Status: DC

## 2013-12-30 MED ORDER — CLONAZEPAM 0.5 MG PO TABS: 0.5000 mg | ORAL_TABLET | Freq: Every evening | ORAL | Status: DC | PRN

## 2013-12-30 NOTE — Patient Instructions (Signed)
Take phentermine daily for weight loss for 90 days. Take Zithromax Z-PAK for respiratory infection. Take Klonopin 0.5 mg at bedtime for sleep

## 2013-12-30 NOTE — Progress Notes (Signed)
   Subjective:    Patient ID: Carla Collins, female    DOB: 1951/09/05, 63 y.o.   MRN: [id number]  HPI Patient has gained 7-1/2 pounds since June 2014. She is driving either to [place]  or [place] at least 4 days a week. She gets up fairly and returns home late. Really hasn't had time to exercise. With regard to diet, she is eating very healthy and low-calorie foods. Went over her diet history which sounds very reasonable. Doesn't exercise on the weekends either. Says she is built like her father. Also think she's coming down with a restaurant or infection. Complains of sore throat. Also tells me today she has a long-standing history of insomnia not sleeping well at night. Says she's not anxious about her job. Hopes to retire in another year or so. Apparently insomnia is long-standing and she doesn't understand why she has it.    Review of Systems     Objective:   Physical Exam both TMs are full bilaterally but not read. Pharynx. Slightly injected without exudate. Neck supple. Chest clear. Went over diet history at length. Spent 25 minutes with patient going overall of these issues.        Assessment & Plan:  Acute bilateral serous otitis media  Acute URI  Insomnia  Weight gain-check thyroid functions  Plan: It is my opinion that a little bit of exercise would help prevent this weight gain but she needs to be committed to it. It's difficult because she travels long distances daily with her work. I gave her phentermine 37.5 mg #90 one by mouth daily. Prescription will not be refilled. This will give her a jump start on diet exercise. Needs to exercise at least 3 times weekly. Thyroid functions are pending. For insomnia prescribed Klonopin 0.5 mg at bedtime. If she is sleeping better, perhaps she'll feel more like exercising. Continue to watch diet and eat healthy.

## 2013-12-31 LAB — T4, FREE: Free T4: 0.87 ng/dL (ref 0.80–1.80)

## 2013-12-31 LAB — HEMOGLOBIN A1C
Hgb A1c MFr Bld: 5.9 % — ABNORMAL HIGH (ref <5.7)
MEAN PLASMA GLUCOSE: 123 mg/dL — AB (ref <117)

## 2013-12-31 LAB — TSH: TSH: 1.412 u[IU]/mL (ref 0.350–4.500)

## 2014-02-17 ENCOUNTER — Other Ambulatory Visit: Payer: 59 | Admitting: Internal Medicine

## 2014-02-20 ENCOUNTER — Encounter: Payer: 59 | Admitting: Internal Medicine

## 2014-03-28 ENCOUNTER — Other Ambulatory Visit: Payer: 59 | Admitting: Internal Medicine

## 2014-03-31 ENCOUNTER — Encounter: Payer: 59 | Admitting: Internal Medicine

## 2014-07-08 ENCOUNTER — Encounter: Payer: Self-pay | Admitting: Internal Medicine

## 2014-07-08 ENCOUNTER — Ambulatory Visit (INDEPENDENT_AMBULATORY_CARE_PROVIDER_SITE_OTHER): Payer: 59 | Admitting: Internal Medicine

## 2014-07-08 VITALS — BP 140/88 | HR 89 | Temp 97.7°F | Wt 193.0 lb

## 2014-07-08 DIAGNOSIS — J069 Acute upper respiratory infection, unspecified: Secondary | ICD-10-CM

## 2014-07-08 MED ORDER — HYDROCODONE-HOMATROPINE 5-1.5 MG/5ML PO SYRP
5.0000 mL | ORAL_SOLUTION | Freq: Three times a day (TID) | ORAL | Status: DC | PRN
Start: 2014-07-08 — End: 2015-04-17

## 2014-07-08 MED ORDER — CLARITHROMYCIN 500 MG PO TABS: 500.0000 mg | ORAL_TABLET | Freq: Two times a day (BID) | ORAL | Status: DC

## 2014-07-08 NOTE — Progress Notes (Signed)
   Subjective:    Patient ID: Carla Collins, female    DOB: 08-16-1951, 63 y.o.   MRN: [id number]  HPI  Patient in today with acute URI symptoms. Has white sputum production. No fever or shaking chills. Some nasal congestion. Has malaise and fatigue.  Father died of myeloma    Review of Systems     Objective:   Physical Exam  Skin warm and dry. No cervical adenopathy. Neck is supple. Chest clear to auscultation. TMs and pharynx clear.      Assessment & Plan:  Acute URI  Plan: Biaxin 500 mg twice daily for 10 days. Hycodan syrup 1 teaspoon by mouth every 8 hours when necessary cough. Call if not better in 7-10 days or sooner if worse.

## 2014-08-01 ENCOUNTER — Other Ambulatory Visit: Payer: Self-pay | Admitting: Internal Medicine

## 2014-10-11 ENCOUNTER — Encounter: Payer: Self-pay | Admitting: Internal Medicine

## 2014-10-11 NOTE — Patient Instructions (Signed)
Take Biaxin as directed. Take Hycodan sparingly for cough. Call if not better in 7-10 days or sooner if worse.

## 2015-04-09 ENCOUNTER — Other Ambulatory Visit: Payer: 59 | Admitting: Internal Medicine

## 2015-04-09 DIAGNOSIS — Z1321 Encounter for screening for nutritional disorder: Secondary | ICD-10-CM

## 2015-04-09 DIAGNOSIS — Z13220 Encounter for screening for lipoid disorders: Secondary | ICD-10-CM

## 2015-04-09 DIAGNOSIS — Z130 Encounter for screening for diseases of the blood and blood-forming organs and certain disorders involving the immune mechanism: Secondary | ICD-10-CM

## 2015-04-09 DIAGNOSIS — Z0000 Encounter for general adult medical examination without abnormal findings: Secondary | ICD-10-CM

## 2015-04-09 DIAGNOSIS — Z1329 Encounter for screening for other suspected endocrine disorder: Secondary | ICD-10-CM

## 2015-04-09 LAB — CBC WITH DIFFERENTIAL/PLATELET
Basophils Absolute: 0 10*3/uL (ref 0.0–0.1)
Basophils Relative: 0 % (ref 0–1)
EOS ABS: 0.1 10*3/uL (ref 0.0–0.7)
Eosinophils Relative: 1 % (ref 0–5)
HCT: 42.5 % (ref 36.0–46.0)
HEMOGLOBIN: 14.4 g/dL (ref 12.0–15.0)
Lymphocytes Relative: 56 % — ABNORMAL HIGH (ref 12–46)
Lymphs Abs: 3.5 10*3/uL (ref 0.7–4.0)
MCH: 30.6 pg (ref 26.0–34.0)
MCHC: 33.9 g/dL (ref 30.0–36.0)
MCV: 90.2 fL (ref 78.0–100.0)
MONO ABS: 0.4 10*3/uL (ref 0.1–1.0)
MPV: 10.5 fL (ref 8.6–12.4)
Monocytes Relative: 6 % (ref 3–12)
Neutro Abs: 2.3 10*3/uL (ref 1.7–7.7)
Neutrophils Relative %: 37 % — ABNORMAL LOW (ref 43–77)
Platelets: 261 10*3/uL (ref 150–400)
RBC: 4.71 MIL/uL (ref 3.87–5.11)
RDW: 14.1 % (ref 11.5–15.5)
WBC: 6.2 10*3/uL (ref 4.0–10.5)

## 2015-04-09 LAB — COMPLETE METABOLIC PANEL WITH GFR
ALBUMIN: 3.8 g/dL (ref 3.6–5.1)
ALT: 17 U/L (ref 6–29)
AST: 22 U/L (ref 10–35)
Alkaline Phosphatase: 88 U/L (ref 33–130)
BUN: 20 mg/dL (ref 7–25)
CO2: 24 mmol/L (ref 20–31)
CREATININE: 0.87 mg/dL (ref 0.50–0.99)
Calcium: 9 mg/dL (ref 8.6–10.4)
Chloride: 108 mmol/L (ref 98–110)
GFR, EST AFRICAN AMERICAN: 83 mL/min (ref >=60)
GFR, Est Non African American: 72 mL/min (ref >=60)
Glucose, Bld: 79 mg/dL (ref 65–99)
POTASSIUM: 4.6 mmol/L (ref 3.5–5.3)
SODIUM: 141 mmol/L (ref 135–146)
Total Bilirubin: 0.6 mg/dL (ref 0.2–1.2)
Total Protein: 7.1 g/dL (ref 6.1–8.1)

## 2015-04-09 LAB — LIPID PANEL
Cholesterol: 185 mg/dL (ref 125–200)
HDL: 63 mg/dL (ref >=46)
LDL CALC: 109 mg/dL (ref <130)
TRIGLYCERIDES: 67 mg/dL (ref <150)
Total CHOL/HDL Ratio: 2.9 Ratio (ref <=5.0)
VLDL: 13 mg/dL (ref <30)

## 2015-04-09 LAB — TSH: TSH: 3.125 u[IU]/mL (ref 0.350–4.500)

## 2015-04-10 LAB — VITAMIN D 25 HYDROXY (VIT D DEFICIENCY, FRACTURES): VIT D 25 HYDROXY: 27 ng/mL — AB (ref 30–100)

## 2015-04-13 ENCOUNTER — Encounter: Payer: Self-pay | Admitting: Internal Medicine

## 2015-04-17 ENCOUNTER — Encounter: Payer: Self-pay | Admitting: Internal Medicine

## 2015-04-17 ENCOUNTER — Ambulatory Visit (INDEPENDENT_AMBULATORY_CARE_PROVIDER_SITE_OTHER): Payer: 59 | Admitting: Internal Medicine

## 2015-04-17 VITALS — BP 108/82 | HR 89 | Temp 97.9°F | Ht 66.0 in | Wt 191.0 lb

## 2015-04-17 DIAGNOSIS — Z8669 Personal history of other diseases of the nervous system and sense organs: Secondary | ICD-10-CM | POA: Diagnosis not present

## 2015-04-17 DIAGNOSIS — Z0000 Encounter for general adult medical examination without abnormal findings: Secondary | ICD-10-CM

## 2015-04-17 DIAGNOSIS — Z820 Family history of epilepsy and other diseases of the nervous system: Secondary | ICD-10-CM

## 2015-04-17 DIAGNOSIS — Z8639 Personal history of other endocrine, nutritional and metabolic disease: Secondary | ICD-10-CM | POA: Diagnosis not present

## 2015-04-17 DIAGNOSIS — E785 Hyperlipidemia, unspecified: Secondary | ICD-10-CM | POA: Diagnosis not present

## 2015-04-17 DIAGNOSIS — R8290 Unspecified abnormal findings in urine: Secondary | ICD-10-CM | POA: Diagnosis not present

## 2015-04-17 DIAGNOSIS — Z803 Family history of malignant neoplasm of breast: Secondary | ICD-10-CM

## 2015-04-17 DIAGNOSIS — Z818 Family history of other mental and behavioral disorders: Secondary | ICD-10-CM

## 2015-04-17 DIAGNOSIS — Z23 Encounter for immunization: Secondary | ICD-10-CM

## 2015-04-17 LAB — POCT URINALYSIS DIPSTICK
Bilirubin, UA: NEGATIVE
Glucose, UA: NEGATIVE
Ketones, UA: NEGATIVE
Nitrite, UA: NEGATIVE
PROTEIN UA: NEGATIVE
Spec Grav, UA: 1.02
UROBILINOGEN UA: NEGATIVE
pH, UA: 5

## 2015-04-17 MED ORDER — ROSUVASTATIN CALCIUM 10 MG PO TABS: ORAL_TABLET | ORAL | Status: DC

## 2015-04-17 MED ORDER — CLONAZEPAM 0.5 MG PO TABS: 0.5000 mg | ORAL_TABLET | Freq: Every day | ORAL | Status: DC

## 2015-04-17 NOTE — Progress Notes (Signed)
   Subjective:    Patient ID: Carla Collins, female    DOB: 1951/07/22, 64 y.o.   MRN: [id number]  HPI 64 year old Black Female in today for health maintenance exam and evaluation of medical issues including hyperlipidemia treated with statin.   She has a history of vitamin D deficiency. History of migraine headaches. 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 History of urticaria 2008. Has had recurrent urinary tract infections from time to time. Patient had D&C in 2000. Has had hysterectomy.   Had colonoscopy in 2007 with 10 year follow-up recommended.  Social history: She is married. Has a Masters degree. 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  Does not smoke or consume alcohol. One daughter.  Family history: Mother diagnosed with breast cancer at age 46 still living with history of dementia and hypertension. Father with history of MI.  One sister alive and well.  [facility] does GYN exam.      Review of Systems  Constitutional: Negative.   All other systems reviewed and are negative.      Objective:   Physical Exam  Constitutional: She is oriented to person, place, and time. She appears well-developed and well-nourished. No distress.  HENT:  Head: Normocephalic and atraumatic.  Right Ear: External ear normal.  Left Ear: External ear normal.  Mouth/Throat: Oropharynx is clear and moist. No oropharyngeal exudate.  Eyes: Conjunctivae and EOM are normal. Pupils are equal, round, and reactive to light. Right eye exhibits no discharge. No scleral icterus.  Neck: Neck supple. No JVD present. No thyromegaly present.  Cardiovascular: Normal rate, normal heart sounds and intact distal pulses.   No murmur heard. Pulmonary/Chest: Effort normal and breath sounds normal. She has no wheezes. She has no rales.  Breasts normal female  Abdominal: Soft. Bowel sounds are normal. She exhibits no distension and no mass. There is no tenderness.  There is no rebound and no guarding.  Genitourinary:  Deferred to GYN  Musculoskeletal: She exhibits no edema.  Lymphadenopathy:    She has no cervical adenopathy.  Neurological: She is alert and oriented to person, place, and time. She has normal reflexes. No cranial nerve deficit. Coordination normal.  Skin: Skin is warm and dry. No rash noted. She is not diaphoretic.  Psychiatric: She has a normal mood and affect. Her behavior is normal. Judgment and thought content normal.  Vitals reviewed.         Assessment & Plan:  Hyperlipidemia-stable on statin medication. Liver functions normal.  History of vitamin D deficiency-vitamin D level low at 27. Recommend daily 2000 units vitamin D 3.  History of migraine headaches  Family history of breast cancer in mother-patient had mammogram July 2016 which was normal  Family history of dementia in mother  Plan: Continue same medications and return in one year or as needed. Have annual mammogram. Last mammogram done at [facility] July 2016 was normal. Colonoscopy due 2017. Dipstick UA abnormal. Urine culture pending.  Addendum: Urine culture -insignificant growth-- no treatment needed

## 2015-04-18 LAB — URINE CULTURE: Colony Count: 45000

## 2015-04-20 ENCOUNTER — Telehealth: Payer: Self-pay | Admitting: *Deleted

## 2015-04-20 NOTE — Telephone Encounter (Signed)
Left message with urine culture results on patient voice mail.

## 2015-08-02 DIAGNOSIS — Z818 Family history of other mental and behavioral disorders: Secondary | ICD-10-CM | POA: Insufficient documentation

## 2015-08-02 NOTE — Patient Instructions (Addendum)
It was pleasure to see you today. Continue same medications and return in one year or as needed. Urine culture pending. Take 2000 units of vitamin D3 daily. Have annual mammogram. Colonoscopy due 2017.

## 2015-10-20 ENCOUNTER — Other Ambulatory Visit: Payer: 59 | Admitting: Internal Medicine

## 2015-10-20 DIAGNOSIS — Z79899 Other long term (current) drug therapy: Secondary | ICD-10-CM

## 2015-10-20 DIAGNOSIS — E785 Hyperlipidemia, unspecified: Secondary | ICD-10-CM

## 2015-10-20 LAB — LIPID PANEL
Cholesterol: 170 mg/dL (ref 125–200)
HDL: 60 mg/dL (ref >=46)
LDL CALC: 98 mg/dL (ref <130)
Total CHOL/HDL Ratio: 2.8 Ratio (ref <=5.0)
Triglycerides: 60 mg/dL (ref <150)
VLDL: 12 mg/dL (ref <30)

## 2015-10-20 LAB — HEPATIC FUNCTION PANEL
ALK PHOS: 91 U/L (ref 33–130)
ALT: 14 U/L (ref 6–29)
AST: 17 U/L (ref 10–35)
Albumin: 3.8 g/dL (ref 3.6–5.1)
BILIRUBIN INDIRECT: 0.4 mg/dL (ref 0.2–1.2)
BILIRUBIN TOTAL: 0.5 mg/dL (ref 0.2–1.2)
Bilirubin, Direct: 0.1 mg/dL (ref <=0.2)
Total Protein: 7.3 g/dL (ref 6.1–8.1)

## 2015-10-22 ENCOUNTER — Encounter: Payer: Self-pay | Admitting: Internal Medicine

## 2015-10-22 ENCOUNTER — Ambulatory Visit (INDEPENDENT_AMBULATORY_CARE_PROVIDER_SITE_OTHER): Payer: 59 | Admitting: Internal Medicine

## 2015-10-22 VITALS — BP 120/78 | HR 76 | Temp 97.0°F | Resp 20 | Ht 66.0 in | Wt 194.0 lb

## 2015-10-22 DIAGNOSIS — E785 Hyperlipidemia, unspecified: Secondary | ICD-10-CM | POA: Diagnosis not present

## 2015-10-22 DIAGNOSIS — E669 Obesity, unspecified: Secondary | ICD-10-CM

## 2015-11-03 ENCOUNTER — Encounter: Payer: Self-pay | Admitting: Internal Medicine

## 2015-11-03 NOTE — Progress Notes (Signed)
   Subjective:    Patient ID: Carla Collins, female    DOB: 03-31-1951, 65 y.o.   MRN: [id number]  HPI 65 year old Black Female now retired in today for six-month recheck on hyperlipidemia. She is on statin therapy. No new complaints or problems. Feels well. She is  obese.    Review of Systems     Objective:   Physical Exam   Skin warm and dry. Nodes none. Neck is supple without JVD thyromegaly or carotid bruits. Chest clear to auscultation. Cardiac exam regular rate and rhythm normal S1 and S2. Extremities without edema. Fasting lipid panel and liver functions are within normal limits on statin medication.      Assessment & Plan:   Hyperlipidemia   Obesity  Plan: Continue statin medication and return in 6 months for  For physical examination.

## 2015-11-03 NOTE — Patient Instructions (Signed)
Continue statin medication and return in 6 months for physical exam. It was pleasure to see you today.

## 2015-11-30 ENCOUNTER — Encounter: Payer: Self-pay | Admitting: Internal Medicine

## 2015-11-30 ENCOUNTER — Ambulatory Visit (INDEPENDENT_AMBULATORY_CARE_PROVIDER_SITE_OTHER): Payer: 59 | Admitting: Internal Medicine

## 2015-11-30 VITALS — BP 116/72 | HR 103 | Temp 98.5°F | Resp 18 | Wt 193.0 lb

## 2015-11-30 DIAGNOSIS — J0100 Acute maxillary sinusitis, unspecified: Secondary | ICD-10-CM

## 2015-11-30 DIAGNOSIS — H6692 Otitis media, unspecified, left ear: Secondary | ICD-10-CM

## 2015-11-30 MED ORDER — HYDROCODONE-HOMATROPINE 5-1.5 MG/5ML PO SYRP: 5.0000 mL | ORAL_SOLUTION | Freq: Three times a day (TID) | ORAL | Status: DC | PRN

## 2015-11-30 MED ORDER — FLUCONAZOLE 150 MG PO TABS: 150.0000 mg | ORAL_TABLET | Freq: Once | ORAL | Status: DC

## 2015-11-30 MED ORDER — CLARITHROMYCIN 500 MG PO TABS
500.0000 mg | ORAL_TABLET | Freq: Two times a day (BID) | ORAL | Status: DC
Start: 2015-11-30 — End: 2016-02-23

## 2015-11-30 NOTE — Patient Instructions (Addendum)
Biaxin 500 mg bid x 10 days. Hycodan one tsp po q 8 hours prn cough. Diflucan if needed for yeast infection. Rest and drink plenty of fluids.

## 2015-11-30 NOTE — Progress Notes (Signed)
   Subjective:    Patient ID: Carla Collins, female    DOB: [birth year], 65 y.o.   MRN: [id number]  HPI Onset last week of URI symptoms. Has had discolored nasal drainage and some low-grade fever. She went to the dentist today and was told she had 2 healing abscesses on her gums. She had not had this previously. She has frontal headache malaise and fatigue. Some cough. No shaking chills or flulike symptoms.    Review of Systems as above     Objective:   Physical Exam  Skin warm and dry. Left TM is full and pink. Right TM clear. Pharynx very slightly injected. Neck is supple without adenopathy. Chest clear to auscultation without rales or wheezing      Assessment & Plan:  Acute sinusitis  Acute left otitis media  Plan: Biaxin 500 mg twice daily for 10 days. Hycodan 1 teaspoon by mouth every 8 hours when necessary cough. Diflucan 150 mg if needed for Candida vaginitis while on antibiotics. Rest and drink plenty of fluids.

## 2016-02-23 ENCOUNTER — Ambulatory Visit (INDEPENDENT_AMBULATORY_CARE_PROVIDER_SITE_OTHER): Admitting: Internal Medicine

## 2016-02-23 ENCOUNTER — Encounter: Payer: Self-pay | Admitting: Internal Medicine

## 2016-02-23 VITALS — BP 130/60 | HR 98 | Temp 98.1°F | Resp 18 | Wt 184.0 lb

## 2016-02-23 DIAGNOSIS — R8290 Unspecified abnormal findings in urine: Secondary | ICD-10-CM | POA: Diagnosis not present

## 2016-02-23 DIAGNOSIS — R300 Dysuria: Secondary | ICD-10-CM | POA: Diagnosis not present

## 2016-02-23 DIAGNOSIS — N390 Urinary tract infection, site not specified: Secondary | ICD-10-CM | POA: Diagnosis not present

## 2016-02-23 LAB — POCT URINALYSIS DIPSTICK
Bilirubin, UA: NEGATIVE
Glucose, UA: NEGATIVE
KETONES UA: NEGATIVE
Nitrite, UA: NEGATIVE
PROTEIN UA: NEGATIVE
SPEC GRAV UA: 1.025
Urobilinogen, UA: 0.2
pH, UA: 6

## 2016-02-23 MED ORDER — CIPROFLOXACIN HCL 500 MG PO TABS: 500.0000 mg | ORAL_TABLET | Freq: Two times a day (BID) | ORAL | Status: DC

## 2016-02-26 LAB — CULTURE, URINE COMPREHENSIVE: Colony Count: 50000

## 2016-03-01 NOTE — Patient Instructions (Signed)
Urine culture pending. Take Cipro 500 mg twice daily for 7 days. May also take Azo-Standard over-the-counter.

## 2016-03-01 NOTE — Progress Notes (Signed)
   Subjective:    Patient ID: Carla Collins, female    DOB: [birth year], 65 y.o.   MRN: [id number]  HPI Patient called earlier today with dysuria and frequency. Feels that she's coming down with a UTI. Had similar symptoms in the past but not frequently treated with Cipro. Was recent episode was August 2016. Culture grew multiple species but she responded to Cipro. No fever or shaking chills.    Review of Systems     Objective:   Physical Exam  No CVA tenderness. Urinalysis is abnormal. Culture taken.         Assessment & Plan:  Acute UTI  Plan: Cipro 500 mg twice daily for 7 days  Addendum: 02/29/2016. 50,000 colonies per milliliter Enterococcus species sensitive to Levaquin. She is on Cipro which should also be sensitive to this organism. She also has coag negative staph in her urine.

## 2016-03-02 ENCOUNTER — Telehealth: Payer: Self-pay

## 2016-03-02 MED ORDER — LEVOFLOXACIN 500 MG PO TABS: 500.0000 mg | ORAL_TABLET | Freq: Every day | ORAL | Status: DC

## 2016-03-02 NOTE — Telephone Encounter (Signed)
Pt still experiencing frequency and urgency

## 2016-03-02 NOTE — Telephone Encounter (Signed)
-----   Message from Elby Showers, MD sent at 02/29/2016  9:35 AM EDT ----- Sensitive to  Levofloxacin. She was treated with Cipro. Make sure she is better

## 2016-03-02 NOTE — Telephone Encounter (Signed)
Attempted to contact patient, no answer, left message for return call.

## 2016-03-28 LAB — HM MAMMOGRAPHY

## 2016-04-18 ENCOUNTER — Other Ambulatory Visit: Admitting: Internal Medicine

## 2016-04-18 DIAGNOSIS — Z0000 Encounter for general adult medical examination without abnormal findings: Secondary | ICD-10-CM

## 2016-04-18 DIAGNOSIS — E559 Vitamin D deficiency, unspecified: Secondary | ICD-10-CM

## 2016-04-18 DIAGNOSIS — E785 Hyperlipidemia, unspecified: Secondary | ICD-10-CM

## 2016-04-18 LAB — COMPLETE METABOLIC PANEL WITH GFR
ALT: 16 U/L (ref 6–29)
AST: 19 U/L (ref 10–35)
Albumin: 3.9 g/dL (ref 3.6–5.1)
Alkaline Phosphatase: 80 U/L (ref 33–130)
BUN: 15 mg/dL (ref 7–25)
CHLORIDE: 106 mmol/L (ref 98–110)
CO2: 27 mmol/L (ref 20–31)
Calcium: 9.3 mg/dL (ref 8.6–10.4)
Creat: 0.86 mg/dL (ref 0.50–0.99)
GFR, Est African American: 83 mL/min (ref >=60)
GFR, Est Non African American: 72 mL/min (ref >=60)
GLUCOSE: 86 mg/dL (ref 65–99)
POTASSIUM: 4.7 mmol/L (ref 3.5–5.3)
SODIUM: 141 mmol/L (ref 135–146)
Total Bilirubin: 0.5 mg/dL (ref 0.2–1.2)
Total Protein: 7.1 g/dL (ref 6.1–8.1)

## 2016-04-18 LAB — TSH: TSH: 2.67 m[IU]/L

## 2016-04-18 LAB — CBC WITH DIFFERENTIAL/PLATELET
BASOS ABS: 56 {cells}/uL (ref 0–200)
BASOS PCT: 1 %
EOS PCT: 1 %
Eosinophils Absolute: 56 cells/uL (ref 15–500)
HCT: 44.9 % (ref 35.0–45.0)
Hemoglobin: 14.8 g/dL (ref 11.7–15.5)
Lymphs Abs: 2800 cells/uL (ref 850–3900)
MCH: 30.2 pg (ref 27.0–33.0)
MCHC: 33 g/dL (ref 32.0–36.0)
MCV: 91.6 fL (ref 80.0–100.0)
MPV: 10.8 fL (ref 7.5–12.5)
Monocytes Absolute: 280 cells/uL (ref 200–950)
Monocytes Relative: 5 %
NEUTROS ABS: 2408 {cells}/uL (ref 1500–7800)
NEUTROS PCT: 43 %
Platelets: 274 10*3/uL (ref 140–400)
RBC: 4.9 MIL/uL (ref 3.80–5.10)
RDW: 13.8 % (ref 11.0–15.0)
WBC: 5.6 10*3/uL (ref 3.8–10.8)

## 2016-04-18 LAB — LIPID PANEL
CHOL/HDL RATIO: 2.5 ratio (ref <=5.0)
CHOLESTEROL: 183 mg/dL (ref 125–200)
HDL: 73 mg/dL (ref >=46)
LDL CALC: 97 mg/dL (ref <130)
Triglycerides: 65 mg/dL (ref <150)
VLDL: 13 mg/dL (ref <30)

## 2016-04-19 LAB — VITAMIN D 25 HYDROXY (VIT D DEFICIENCY, FRACTURES): VIT D 25 HYDROXY: 27 ng/mL — AB (ref 30–100)

## 2016-04-22 ENCOUNTER — Ambulatory Visit (INDEPENDENT_AMBULATORY_CARE_PROVIDER_SITE_OTHER): Admitting: Internal Medicine

## 2016-04-22 ENCOUNTER — Encounter: Payer: Self-pay | Admitting: Internal Medicine

## 2016-04-22 VITALS — BP 122/64 | HR 76 | Temp 97.4°F | Ht 66.0 in | Wt 189.0 lb

## 2016-04-22 DIAGNOSIS — Z0001 Encounter for general adult medical examination with abnormal findings: Secondary | ICD-10-CM | POA: Diagnosis not present

## 2016-04-22 DIAGNOSIS — E785 Hyperlipidemia, unspecified: Secondary | ICD-10-CM

## 2016-04-22 DIAGNOSIS — Z8639 Personal history of other endocrine, nutritional and metabolic disease: Secondary | ICD-10-CM

## 2016-04-22 DIAGNOSIS — R8290 Unspecified abnormal findings in urine: Secondary | ICD-10-CM | POA: Diagnosis not present

## 2016-04-22 DIAGNOSIS — Z0000 Encounter for general adult medical examination without abnormal findings: Secondary | ICD-10-CM

## 2016-04-22 LAB — POCT URINALYSIS DIPSTICK
BILIRUBIN UA: 0.2
GLUCOSE UA: NEGATIVE
KETONES UA: NEGATIVE
Nitrite, UA: NEGATIVE
PROTEIN UA: NEGATIVE
SPEC GRAV UA: 1.025
Urobilinogen, UA: 0.2
pH, UA: 5

## 2016-04-22 MED ORDER — ROSUVASTATIN CALCIUM 10 MG PO TABS: ORAL_TABLET | ORAL | 3 refills | Status: DC

## 2016-04-22 MED ORDER — CLONAZEPAM 0.5 MG PO TABS: 0.5000 mg | ORAL_TABLET | Freq: Every day | ORAL | 0 refills | Status: DC

## 2016-04-22 MED ORDER — CIPROFLOXACIN HCL 500 MG PO TABS: 500.0000 mg | ORAL_TABLET | Freq: Two times a day (BID) | ORAL | 0 refills | Status: DC

## 2016-04-22 NOTE — Progress Notes (Signed)
   Subjective:    Patient ID: Carla Collins, female    DOB: [birth year], 65 y.o.   MRN: [id number]  HPI  65 year old Black Female for health maintenance exam and evaluation of medical issues. She has history of hyperlipidemia treated with statin medication. History of vitamin D deficiency. History of occasional urinary tract infections. History of migraine headaches. 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 History of urticaria in 2008. She had a D&C in 2000. She has had a hysterectomy.  Had colonoscopy in 2007 with 10 year follow-up recommended. She should call gastroenterologist regarding follow-up. Her F social history: She is married. Has a Masters degree. 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 One daughter.  Family history: Mother diagnosed with breast cancer at age 68 still living with history of dementia and hypertension. Now living with patient. Father with history of MI. One sister alive and well.    Review of Systems concern she may have another urinary infection today. She says she feels symptomatic.     Objective:   Physical Exam  Constitutional: She is oriented to person, place, and time. She appears well-developed and well-nourished.  HENT:  Head: Normocephalic and atraumatic.  Right Ear: External ear normal.  Left Ear: External ear normal.  Mouth/Throat: No oropharyngeal exudate.  Eyes: Conjunctivae are normal. Right eye exhibits no discharge. Left eye exhibits no discharge. No scleral icterus.  Neck: No thyromegaly present.  Cardiovascular: Normal rate, regular rhythm and normal heart sounds.   No murmur heard. Pulmonary/Chest:  Breasts normal female without masses  Abdominal: Soft. Bowel sounds are normal. She exhibits no mass. There is no rebound and no guarding.  Genitourinary:  Genitourinary Comments: Deferred to GYN  Musculoskeletal: She exhibits no edema.  Lymphadenopathy:    She has  no cervical adenopathy.  Neurological: She is alert and oriented to person, place, and time. She has normal reflexes. No cranial nerve deficit. Coordination normal.  Skin: No rash noted.  Psychiatric: She has a normal mood and affect. Her behavior is normal. Judgment and thought content normal.  Vitals reviewed.         Assessment & Plan:  Hyperlipidemia-lipid panel liver functions normal on statin therapy. Continue same statin medication return in one year  History of urinary tract infections-urinalysis abnormal. Culture pending. Has 2+ LE on urine dipstick today  Vitamin D deficiency-take 2000 units vitamin D 3 daily  Plan: Return in one year or as needed.

## 2016-04-24 LAB — URINE CULTURE

## 2016-04-25 ENCOUNTER — Telehealth: Payer: Self-pay

## 2016-04-25 NOTE — Telephone Encounter (Signed)
-----   Message from Elby Showers, MD sent at 04/24/2016  1:47 PM EDT ----- Multiple species. No significant UTI. Hope she is feeling better.

## 2016-04-25 NOTE — Telephone Encounter (Signed)
Called patient. No answer. Will try later.  

## 2016-04-28 NOTE — Telephone Encounter (Signed)
-----   Message from Elby Showers, MD sent at 04/24/2016  1:47 PM EDT ----- Multiple species. No significant UTI. Hope she is feeling better.

## 2016-04-28 NOTE — Telephone Encounter (Signed)
Called patient to give lab results. No answer. Will try later.  

## 2016-05-01 ENCOUNTER — Other Ambulatory Visit: Payer: Self-pay | Admitting: Internal Medicine

## 2016-05-03 NOTE — Telephone Encounter (Signed)
Called patient.  No answer.

## 2016-05-04 NOTE — Patient Instructions (Addendum)
Urine culture pending. Continue same medications and return in one year or as needed. Please take 2000 units vitamin D 3 daily.

## 2016-06-14 ENCOUNTER — Encounter: Payer: Self-pay | Admitting: Gastroenterology

## 2016-08-11 ENCOUNTER — Encounter: Payer: Self-pay | Admitting: Internal Medicine

## 2016-08-11 ENCOUNTER — Telehealth: Payer: Self-pay | Admitting: Internal Medicine

## 2016-08-11 ENCOUNTER — Ambulatory Visit
Admission: RE | Admit: 2016-08-11 | Discharge: 2016-08-11 | Disposition: A | Discharge status: Home / Self Care | Payer: Self-pay | Source: Ambulatory Visit | Attending: Internal Medicine | Admitting: Internal Medicine

## 2016-08-11 ENCOUNTER — Ambulatory Visit (INDEPENDENT_AMBULATORY_CARE_PROVIDER_SITE_OTHER): Admitting: Internal Medicine

## 2016-08-11 VITALS — BP 108/78 | HR 120 | Temp 100.5°F | Ht 66.0 in | Wt 187.0 lb

## 2016-08-11 DIAGNOSIS — R05 Cough: Secondary | ICD-10-CM | POA: Diagnosis not present

## 2016-08-11 DIAGNOSIS — H6501 Acute serous otitis media, right ear: Secondary | ICD-10-CM

## 2016-08-11 DIAGNOSIS — H6692 Otitis media, unspecified, left ear: Secondary | ICD-10-CM

## 2016-08-11 DIAGNOSIS — R059 Cough, unspecified: Secondary | ICD-10-CM

## 2016-08-11 DIAGNOSIS — R509 Fever, unspecified: Secondary | ICD-10-CM

## 2016-08-11 DIAGNOSIS — J181 Lobar pneumonia, unspecified organism: Secondary | ICD-10-CM

## 2016-08-11 DIAGNOSIS — R8290 Unspecified abnormal findings in urine: Secondary | ICD-10-CM

## 2016-08-11 DIAGNOSIS — J189 Pneumonia, unspecified organism: Secondary | ICD-10-CM

## 2016-08-11 LAB — POCT URINALYSIS DIPSTICK
BILIRUBIN UA: NEGATIVE
Glucose, UA: NEGATIVE
Nitrite, UA: NEGATIVE
PH UA: 6
Protein, UA: NEGATIVE
SPEC GRAV UA: 1.025
Urobilinogen, UA: NEGATIVE

## 2016-08-11 LAB — CBC WITH DIFFERENTIAL/PLATELET
BASOS ABS: 0 {cells}/uL (ref 0–200)
Basophils Relative: 0 %
EOS ABS: 0 {cells}/uL — AB (ref 15–500)
Eosinophils Relative: 0 %
HCT: 42 % (ref 35.0–45.0)
HEMOGLOBIN: 14.5 g/dL (ref 11.7–15.5)
LYMPHS ABS: 1648 {cells}/uL (ref 850–3900)
Lymphocytes Relative: 8 %
MCH: 31 pg (ref 27.0–33.0)
MCHC: 34.5 g/dL (ref 32.0–36.0)
MCV: 89.9 fL (ref 80.0–100.0)
MONOS PCT: 8 %
MPV: 11.6 fL (ref 7.5–12.5)
Monocytes Absolute: 1648 cells/uL — ABNORMAL HIGH (ref 200–950)
NEUTROS ABS: 17304 {cells}/uL — AB (ref 1500–7800)
Neutrophils Relative %: 84 %
Platelets: 272 10*3/uL (ref 140–400)
RBC: 4.67 MIL/uL (ref 3.80–5.10)
RDW: 14.1 % (ref 11.0–15.0)
WBC: 20.6 10*3/uL — ABNORMAL HIGH (ref 3.8–10.8)

## 2016-08-11 MED ORDER — CEFTRIAXONE SODIUM 1 G IJ SOLR
1.0000 g | Freq: Once | INTRAMUSCULAR | Status: AC
  Administered 2016-08-11: 1 g via INTRAMUSCULAR

## 2016-08-11 MED ORDER — ONDANSETRON HCL 4 MG PO TABS: 4.0000 mg | ORAL_TABLET | Freq: Three times a day (TID) | ORAL | 0 refills | Status: DC | PRN

## 2016-08-11 MED ORDER — LEVOFLOXACIN 500 MG PO TABS: 500.0000 mg | ORAL_TABLET | Freq: Every day | ORAL | 0 refills | Status: DC

## 2016-08-11 MED ORDER — HYDROCODONE-HOMATROPINE 5-1.5 MG/5ML PO SYRP: 5.0000 mL | ORAL_SOLUTION | Freq: Three times a day (TID) | ORAL | 0 refills | Status: DC | PRN

## 2016-08-11 NOTE — Telephone Encounter (Signed)
-----   Message from Elby Showers, MD sent at 08/11/2016  2:11 PM EST ----- Pt has right upper lobe pneumonia. Must rest at home at drink plenty of fluids. Take meds as directed. See Monday for follow up.

## 2016-08-11 NOTE — Telephone Encounter (Signed)
Patient aware of results and follow up appointment made.

## 2016-08-11 NOTE — Patient Instructions (Signed)
1 g IM Rocephin given in office. Levaquin 500 milligrams daily for 10 days. Return on December 11. Zofran if needed for nausea. Rest at home and drink plenty of fluids.

## 2016-08-11 NOTE — Progress Notes (Signed)
   Subjective:    Patient ID: Carla Collins, female    DOB: [birth year], 65 y.o.   MRN: [id number]  HPI Onset before Thanksgiving of URI symptoms. Patient had cough cold and congestion. Last night had shaking chills with fever. Husband said she had noisy breathing while sleeping. She has discolored sputum production. Symptoms initially started with a sore throat but she has no sore throat nail. She did receive flu vaccine.    Review of Systems     Objective:   Physical Exam Skin warm and dry. Looks fatigued. Left TM is red. Right TM is full. Pharynx very slightly injected without exudate. Neck is supple without significant adenopathy. Chest is clear without significant rales or wheezing.  Dipstick UA is abnormal. Culture sent. Chest x-ray shows right upper lobe pneumonia. Right upper lobe pneumonia        Assessment & Plan:  Right serous otitis media  Left otitis media  Abnormal urinalysis-culture pending  Plan: Rocephin 1 g IM. Levaquin 500 milligrams daily for 10 days. Zofran 4 mg tablets 1 by mouth every 8 hours when necessary nausea. Rest and drink plenty of fluids. Return on Monday, December 11 for follow-up.

## 2016-08-12 LAB — URINE CULTURE

## 2016-08-15 ENCOUNTER — Encounter: Payer: Self-pay | Admitting: Internal Medicine

## 2016-08-15 ENCOUNTER — Ambulatory Visit (INDEPENDENT_AMBULATORY_CARE_PROVIDER_SITE_OTHER): Admitting: Internal Medicine

## 2016-08-15 VITALS — BP 138/88 | HR 87 | Temp 98.0°F | Wt 183.0 lb

## 2016-08-15 DIAGNOSIS — J189 Pneumonia, unspecified organism: Secondary | ICD-10-CM

## 2016-08-15 DIAGNOSIS — J181 Lobar pneumonia, unspecified organism: Secondary | ICD-10-CM

## 2016-08-15 DIAGNOSIS — B009 Herpesviral infection, unspecified: Secondary | ICD-10-CM

## 2016-08-15 LAB — CBC WITH DIFFERENTIAL/PLATELET
BASOS ABS: 0 {cells}/uL (ref 0–200)
Basophils Relative: 0 %
EOS ABS: 86 {cells}/uL (ref 15–500)
Eosinophils Relative: 1 %
HEMATOCRIT: 41.1 % (ref 35.0–45.0)
Hemoglobin: 13.6 g/dL (ref 11.7–15.5)
LYMPHS PCT: 36 %
Lymphs Abs: 3096 cells/uL (ref 850–3900)
MCH: 29.8 pg (ref 27.0–33.0)
MCHC: 33.1 g/dL (ref 32.0–36.0)
MCV: 90.1 fL (ref 80.0–100.0)
MONO ABS: 774 {cells}/uL (ref 200–950)
MONOS PCT: 9 %
MPV: 10.4 fL (ref 7.5–12.5)
NEUTROS PCT: 54 %
Neutro Abs: 4644 cells/uL (ref 1500–7800)
PLATELETS: 347 10*3/uL (ref 140–400)
RBC: 4.56 MIL/uL (ref 3.80–5.10)
RDW: 13.7 % (ref 11.0–15.0)
WBC: 8.6 10*3/uL (ref 3.8–10.8)

## 2016-08-15 MED ORDER — VALACYCLOVIR HCL 500 MG PO TABS: 500.0000 mg | ORAL_TABLET | Freq: Two times a day (BID) | ORAL | 1 refills | Status: AC

## 2016-08-15 NOTE — Progress Notes (Signed)
   Subjective:    Patient ID: Carla Collins, female    DOB: 12-Jan-1951, 65 y.o.   MRN: [id number]  HPI   65 year old Female diagnosed recentlyDiagnosed December 7 with right upper lobe pneumonia. She's been treated with Levaquin at home after receiving 1 g IM Rocephin here. She had a white blood cell count of 20,600 on December 7 with a left shift and this will be repeated today. 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 Still coughing some. Has several more days of Levaquin left. Able to eat. Previously had nausea.    Review of Systems see above     Objective:   Physical Exam Lesion consistent with HSV type on right lower lip. Chest is clear to auscultation without rales or wheezing. She has a congested cough. Neck is supple.       Assessment & Plan:  Right upper lobe pneumonia  HSV type I  Plan: Continue course of Levaquin 500 milligrams daily. Repeat chest x-ray December 15 with further instructions to follow-up. Valtrex 500 mg twice daily for 5 days. CBC with differential drawn today is pending.

## 2016-08-15 NOTE — Patient Instructions (Addendum)
Complete course of Levaquin. Have repeat chest x-ray December 15. Continue to rest and drink plenty of fluids. CBC with differential repeated today. Take Valtrex for HSV type I.

## 2016-08-15 NOTE — Progress Notes (Signed)
cbc

## 2016-08-16 ENCOUNTER — Telehealth: Payer: Self-pay | Admitting: Internal Medicine

## 2016-08-16 NOTE — Telephone Encounter (Signed)
-----   Message from Elby Showers, MD sent at 08/16/2016 10:00 AM EST ----- WBC count is now normal

## 2016-08-16 NOTE — Telephone Encounter (Signed)
Left message informing patient of normal CBC.

## 2016-08-19 ENCOUNTER — Telehealth: Payer: Self-pay | Admitting: Internal Medicine

## 2016-08-19 ENCOUNTER — Ambulatory Visit
Admission: RE | Admit: 2016-08-19 | Discharge: 2016-08-19 | Disposition: A | Discharge status: Home / Self Care | Source: Ambulatory Visit | Attending: Internal Medicine | Admitting: Internal Medicine

## 2016-08-19 DIAGNOSIS — J189 Pneumonia, unspecified organism: Secondary | ICD-10-CM

## 2016-08-19 NOTE — Telephone Encounter (Signed)
-----   Message from Elby Showers, MD sent at 08/19/2016 11:08 AM EST ----- Pneumonia has resolved. Finish meds as prescribed

## 2016-08-19 NOTE — Telephone Encounter (Signed)
Left message informing patient of resolved pneumonia.

## 2016-10-25 ENCOUNTER — Other Ambulatory Visit: Admitting: Internal Medicine

## 2016-10-25 DIAGNOSIS — Z79899 Other long term (current) drug therapy: Secondary | ICD-10-CM

## 2016-10-25 DIAGNOSIS — E785 Hyperlipidemia, unspecified: Secondary | ICD-10-CM

## 2016-10-25 LAB — HEPATIC FUNCTION PANEL
ALBUMIN: 3.7 g/dL (ref 3.6–5.1)
ALK PHOS: 105 U/L (ref 33–130)
ALT: 19 U/L (ref 6–29)
AST: 24 U/L (ref 10–35)
Bilirubin, Direct: 0.1 mg/dL (ref <=0.2)
Indirect Bilirubin: 0.4 mg/dL (ref 0.2–1.2)
TOTAL PROTEIN: 6.8 g/dL (ref 6.1–8.1)
Total Bilirubin: 0.5 mg/dL (ref 0.2–1.2)

## 2016-10-25 LAB — LIPID PANEL
Cholesterol: 171 mg/dL (ref <200)
HDL: 62 mg/dL (ref >50)
LDL CALC: 97 mg/dL (ref <100)
Total CHOL/HDL Ratio: 2.8 Ratio (ref <5.0)
Triglycerides: 61 mg/dL (ref <150)
VLDL: 12 mg/dL (ref <30)

## 2016-10-27 ENCOUNTER — Ambulatory Visit (INDEPENDENT_AMBULATORY_CARE_PROVIDER_SITE_OTHER): Admitting: Internal Medicine

## 2016-10-27 ENCOUNTER — Encounter: Payer: Self-pay | Admitting: Internal Medicine

## 2016-10-27 VITALS — BP 110/80 | HR 91 | Temp 97.2°F | Ht 66.0 in | Wt 184.0 lb

## 2016-10-27 DIAGNOSIS — H9201 Otalgia, right ear: Secondary | ICD-10-CM

## 2016-10-27 DIAGNOSIS — E785 Hyperlipidemia, unspecified: Secondary | ICD-10-CM

## 2016-10-27 DIAGNOSIS — M542 Cervicalgia: Secondary | ICD-10-CM

## 2016-10-27 MED ORDER — CYCLOBENZAPRINE HCL 10 MG PO TABS: 10.0000 mg | ORAL_TABLET | Freq: Every day | ORAL | 0 refills | Status: DC

## 2016-10-27 NOTE — Patient Instructions (Signed)
Take Flexeril at bedtime. Apply ice or heat to neck. Take Advil or Aleve for neck pain. Call if not better in 2 weeks. Continue statin medication. Right ear canal appears to be within normal limits. Return in 6 months for physical exam.

## 2016-10-27 NOTE — Progress Notes (Signed)
   Subjective:    Patient ID: Carla Collins, female    DOB: [birth year], 66 y.o.   MRN: [id number]  HPI 66 year old retired female in today for follow-up on hyperlipidemia. Lipid panel liver functions are normal.  Complaining of some right ear discomfort. Right TM appears to be normal and there is no wax in the right external ear canal.  Has been having issues with neck pain for several weeks. Doesn't recall any injury or heavy lifting. Neck is sore with rotation particular to the left.    Review of Systems     Objective:   Physical Exam  Right TM normal without cerumen. Chest palpable muscle spasm bilaterally in both sternocleidomastoid muscles were pronounced on the left than the right.  Deep tendon reflexes 2+ and symmetrical in muscle strength is normal in the upper extremities.      Assessment & Plan:  Bilateral muscle spasm in neck  Hyperlipidemia-stable on statin medication  Right otalgia  Plan: Do not see anything wrong with right external ear canal or TM. For neck pain she will use Aleve or Advil. I will prescribe Flexeril 10 mg at bedtime and she will use ice or heat on neck. Continue statin medication and return in 6 months for physical examination

## 2016-12-13 ENCOUNTER — Other Ambulatory Visit: Payer: Self-pay | Admitting: Internal Medicine

## 2017-01-09 ENCOUNTER — Other Ambulatory Visit: Payer: Self-pay | Admitting: Internal Medicine

## 2017-01-09 NOTE — Telephone Encounter (Signed)
Does pt need this Rx?

## 2017-01-10 NOTE — Telephone Encounter (Signed)
Called patient she said she needs this medication for her neck spasms, she said she was here about 2 months a go for that. She said the pain is getting better.

## 2017-03-16 ENCOUNTER — Encounter: Payer: Self-pay | Admitting: Internal Medicine

## 2017-03-16 ENCOUNTER — Ambulatory Visit (INDEPENDENT_AMBULATORY_CARE_PROVIDER_SITE_OTHER): Admitting: Internal Medicine

## 2017-03-16 VITALS — BP 118/80 | Wt 187.0 lb

## 2017-03-16 DIAGNOSIS — M542 Cervicalgia: Secondary | ICD-10-CM

## 2017-03-16 DIAGNOSIS — F4320 Adjustment disorder, unspecified: Secondary | ICD-10-CM

## 2017-03-16 DIAGNOSIS — F4321 Adjustment disorder with depressed mood: Secondary | ICD-10-CM

## 2017-03-16 MED ORDER — ALPRAZOLAM 0.5 MG PO TABS: 0.5000 mg | ORAL_TABLET | Freq: Two times a day (BID) | ORAL | 0 refills | Status: DC | PRN

## 2017-03-16 NOTE — Patient Instructions (Signed)
Say physical therapist regarding bilateral neck pain. Takes Xanax sparingly for grief reaction and insomnia.

## 2017-03-16 NOTE — Progress Notes (Signed)
   Subjective:    Patient ID: Carla Collins, female    DOB: [birth year], 66 y.o.   MRN: [id number]  HPI Patient here today for persistent neck pain not relieved with Flexeril. Her mother is suffering from end-stage dementia. Hospice has been called in. Patient had been trying to lift her mother out of the bed. They now have a hospital bed and she's not doing all the lifting she wants. She does have some anxiety and grief reaction with mother's illness which is appropriate. I have prescribed some Xanax for her. Sometimes Flexeril makes her a bit groggy when she first gets up but she has been cutting it in half.  She has no radiculopathy symptoms just neck pain which is now bilateral.    Review of Systems see above     Objective:   Physical Exam She has palpable muscle spasm particularly in the left sternocleidomastoid muscle area. She is tender in both sternocleidomastoid muscles. Deep tendon reflexes are 2+ and symmetrical in the upper extremities and muscle strength in the upper extremities is normal. Sensation is intact       Assessment & Plan:  Bilateral neck pain-musculoskeletal  Plan: We are going to refer her to physical therapy. Given her some Xanax to have on hand for anxiety and grief. She may take Flexeril as needed. Order completed for [facility] therapy

## 2017-03-29 ENCOUNTER — Encounter: Payer: Self-pay | Admitting: Internal Medicine

## 2017-03-29 DIAGNOSIS — Z1231 Encounter for screening mammogram for malignant neoplasm of breast: Secondary | ICD-10-CM | POA: Diagnosis not present

## 2017-03-29 DIAGNOSIS — Z803 Family history of malignant neoplasm of breast: Secondary | ICD-10-CM | POA: Diagnosis not present

## 2017-04-24 ENCOUNTER — Other Ambulatory Visit: Admitting: Internal Medicine

## 2017-04-27 ENCOUNTER — Encounter: Admitting: Internal Medicine

## 2017-07-09 ENCOUNTER — Other Ambulatory Visit: Payer: Self-pay | Admitting: Internal Medicine

## 2017-08-01 ENCOUNTER — Other Ambulatory Visit: Payer: Self-pay | Admitting: Internal Medicine

## 2017-10-22 DIAGNOSIS — H109 Unspecified conjunctivitis: Secondary | ICD-10-CM | POA: Diagnosis not present

## 2017-11-02 ENCOUNTER — Ambulatory Visit (INDEPENDENT_AMBULATORY_CARE_PROVIDER_SITE_OTHER): Payer: Medicare Other | Admitting: Internal Medicine

## 2017-11-02 ENCOUNTER — Encounter: Payer: Self-pay | Admitting: Internal Medicine

## 2017-11-02 VITALS — BP 130/88 | HR 77 | Temp 98.1°F | Ht 67.0 in | Wt 189.0 lb

## 2017-11-02 DIAGNOSIS — B309 Viral conjunctivitis, unspecified: Secondary | ICD-10-CM | POA: Diagnosis not present

## 2017-11-02 MED ORDER — OFLOXACIN 0.3 % OP SOLN: OPHTHALMIC | 1 refills | Status: DC

## 2017-11-02 NOTE — Progress Notes (Signed)
   Subjective:    Patient ID: Carla Collins, female    DOB: 12-08-1950, 67 y.o.   MRN: [id number]  HPI Refilled eyedrops for right eye conjunctivitis Feb 17 which was an Rx for  trimethoprim and polymixin.  This was not something I had prescribed.  Says she is not getting better.  Has crusting in right eye in the mornings and puffiness under her eye.  Says left eye feels okay.  Wears glasses.  Her mother died recently of complications of dementia.  She seems to be doing okay.  Her husband broke his leg recently.    Review of Systems see above     Objective:   Physical Exam Conjunctiva inflamed.  Puffiness noted under right eye.  Extraocular movements are full in the right eye.  PERRLA.       Assessment & Plan:  Conjunctivitis right eye  Plan: Ocuflox ophthalmic drops 2 drops in each eye 4 times a day for 5-7 days.  Warm hot compresses to right eye 20 minutes 2 or 3 times a day.

## 2017-11-02 NOTE — Patient Instructions (Signed)
Ocuflox ophthalmic drops 2 drops in  both eyes 4 times a day for 5-7 days.

## 2017-11-03 ENCOUNTER — Encounter: Payer: Self-pay | Admitting: Internal Medicine

## 2017-11-03 ENCOUNTER — Telehealth: Payer: Self-pay

## 2017-11-03 ENCOUNTER — Ambulatory Visit (INDEPENDENT_AMBULATORY_CARE_PROVIDER_SITE_OTHER): Payer: Medicare Other | Admitting: Internal Medicine

## 2017-11-03 VITALS — Ht 67.0 in | Wt 189.0 lb

## 2017-11-03 DIAGNOSIS — H1011 Acute atopic conjunctivitis, right eye: Secondary | ICD-10-CM | POA: Diagnosis not present

## 2017-11-03 MED ORDER — METHYLPREDNISOLONE ACETATE 80 MG/ML IJ SUSP
80.0000 mg | Freq: Once | INTRAMUSCULAR | Status: AC
  Administered 2017-11-03: 80 mg via INTRAMUSCULAR

## 2017-11-03 NOTE — Telephone Encounter (Signed)
Patient will come today at 2:15 for appointment.

## 2017-11-03 NOTE — Patient Instructions (Signed)
Depo-Medrol 80 mg IM.  Use hydrocortisone 1% cream under her eyelid sparingly 3 times daily.  Call if not better next week.  Stop ofloxacin ophthalmic drops.

## 2017-11-03 NOTE — Telephone Encounter (Signed)
Patient called states she thinks she might had an allergic reaction to the eye drops she got yesterday she said her eye is worse and is swollen. Please advise.

## 2017-11-03 NOTE — Progress Notes (Signed)
   Subjective:    Patient ID: Carla Collins, female    DOB: [birth year], 67 y.o.   MRN: [id number]  HPI She has a history of sulfa allergy.  She was prescribed trimethoprim and polymyxin drops on February 17.  I saw her yesterday and prescribed ofloxacin ophthalmic drops.  She feels that she has itching under her right eyelid with some puffiness that is worse than yesterday.  We asked her to come back.  He thought she might have had a reaction to the ofloxacin ophthalmic drops.  She has a presentation to get up to 400 people tomorrow and she is concerned about her appearance however she feels that the conjunctivitis is better with these eyedrops.  She did have a small amount of infraorbital edema of the right eye yesterday but it seems rather today.  There is no drainage from her eyes.    Review of Systems     Objective:   Physical Exam  There is no drainage from the right eye.  Conjunctiva looks I think a little less inflamed than yesterday.  She does have some erythema and puffiness under her right lower eyelid.  The upper lids are not swollen.     Assessment & Plan:  Itching and infraorbital edema right eye.?  Allergic contact dermatitis versus reaction to antibiotic eyedrops.  Plan: She is to apply hydrocortisone cream 1% under eyelid 3 times daily.  We gave her Depo-Medrol 80 mg IM.  She will stop the antibiotic eyedrops.

## 2017-11-03 NOTE — Addendum Note (Signed)
Addended by: Mady Haagensen on: 11/03/2017 03:05 PM   Modules accepted: Orders

## 2017-11-03 NOTE — Telephone Encounter (Signed)
Please have her come back.

## 2017-12-27 ENCOUNTER — Telehealth: Payer: Self-pay | Admitting: Internal Medicine

## 2017-12-27 NOTE — Telephone Encounter (Signed)
LVM to CB to confirm [employer] appt. Plus schedule lab appt.

## 2018-01-12 ENCOUNTER — Other Ambulatory Visit: Payer: Medicare Other | Admitting: Internal Medicine

## 2018-01-12 DIAGNOSIS — Z1329 Encounter for screening for other suspected endocrine disorder: Secondary | ICD-10-CM | POA: Diagnosis not present

## 2018-01-12 DIAGNOSIS — E785 Hyperlipidemia, unspecified: Secondary | ICD-10-CM

## 2018-01-12 DIAGNOSIS — Z0000 Encounter for general adult medical examination without abnormal findings: Secondary | ICD-10-CM

## 2018-01-12 LAB — LIPID PANEL
CHOLESTEROL: 198 mg/dL (ref <200)
HDL: 61 mg/dL (ref >50)
LDL Cholesterol (Calc): 115 mg/dL (calc) — ABNORMAL HIGH
Non-HDL Cholesterol (Calc): 137 mg/dL (calc) — ABNORMAL HIGH (ref <130)
Total CHOL/HDL Ratio: 3.2 (calc) (ref <5.0)
Triglycerides: 112 mg/dL (ref <150)

## 2018-01-12 LAB — COMPLETE METABOLIC PANEL WITH GFR
AG Ratio: 1.3 (calc) (ref 1.0–2.5)
ALT: 15 U/L (ref 6–29)
AST: 19 U/L (ref 10–35)
Albumin: 3.9 g/dL (ref 3.6–5.1)
Alkaline phosphatase (APISO): 96 U/L (ref 33–130)
BUN: 13 mg/dL (ref 7–25)
CALCIUM: 9.1 mg/dL (ref 8.6–10.4)
CHLORIDE: 106 mmol/L (ref 98–110)
CO2: 26 mmol/L (ref 20–32)
CREATININE: 0.8 mg/dL (ref 0.50–0.99)
GFR, Est African American: 90 mL/min/{1.73_m2} (ref >=60)
GFR, Est Non African American: 77 mL/min/{1.73_m2} (ref >=60)
GLOBULIN: 3.1 g/dL (ref 1.9–3.7)
Glucose, Bld: 86 mg/dL (ref 65–99)
Potassium: 4 mmol/L (ref 3.5–5.3)
SODIUM: 141 mmol/L (ref 135–146)
Total Bilirubin: 0.4 mg/dL (ref 0.2–1.2)
Total Protein: 7 g/dL (ref 6.1–8.1)

## 2018-01-12 LAB — TSH: TSH: 3.25 mIU/L (ref 0.40–4.50)

## 2018-01-12 LAB — CBC WITH DIFFERENTIAL/PLATELET
Basophils Absolute: 41 cells/uL (ref 0–200)
Basophils Relative: 0.7 %
EOS PCT: 1.5 %
Eosinophils Absolute: 89 cells/uL (ref 15–500)
HCT: 44 % (ref 35.0–45.0)
Hemoglobin: 14.7 g/dL (ref 11.7–15.5)
Lymphs Abs: 3174 cells/uL (ref 850–3900)
MCH: 29.7 pg (ref 27.0–33.0)
MCHC: 33.4 g/dL (ref 32.0–36.0)
MCV: 88.9 fL (ref 80.0–100.0)
MPV: 11.4 fL (ref 7.5–12.5)
Monocytes Relative: 6.9 %
NEUTROS PCT: 37.1 %
Neutro Abs: 2189 cells/uL (ref 1500–7800)
PLATELETS: 269 10*3/uL (ref 140–400)
RBC: 4.95 10*6/uL (ref 3.80–5.10)
RDW: 13.6 % (ref 11.0–15.0)
TOTAL LYMPHOCYTE: 53.8 %
WBC: 5.9 10*3/uL (ref 3.8–10.8)
WBCMIX: 407 {cells}/uL (ref 200–950)

## 2018-01-12 NOTE — Addendum Note (Signed)
Addended by: Mady Haagensen on: 01/12/2018 09:18 AM   Modules accepted: Orders

## 2018-01-16 ENCOUNTER — Encounter: Payer: Self-pay | Admitting: Internal Medicine

## 2018-01-16 ENCOUNTER — Other Ambulatory Visit (HOSPITAL_COMMUNITY)
Admission: RE | Admit: 2018-01-16 | Discharge: 2018-01-16 | Disposition: A | Discharge status: Home / Self Care | Payer: Medicare Other | Source: Ambulatory Visit | Attending: Internal Medicine | Admitting: Internal Medicine

## 2018-01-16 ENCOUNTER — Ambulatory Visit (INDEPENDENT_AMBULATORY_CARE_PROVIDER_SITE_OTHER): Payer: Medicare Other | Admitting: Internal Medicine

## 2018-01-16 VITALS — BP 110/82 | HR 84 | Ht 67.0 in | Wt 188.0 lb

## 2018-01-16 DIAGNOSIS — R8290 Unspecified abnormal findings in urine: Secondary | ICD-10-CM

## 2018-01-16 DIAGNOSIS — M542 Cervicalgia: Secondary | ICD-10-CM

## 2018-01-16 DIAGNOSIS — Z8639 Personal history of other endocrine, nutritional and metabolic disease: Secondary | ICD-10-CM

## 2018-01-16 DIAGNOSIS — E785 Hyperlipidemia, unspecified: Secondary | ICD-10-CM | POA: Diagnosis not present

## 2018-01-16 DIAGNOSIS — Z23 Encounter for immunization: Secondary | ICD-10-CM

## 2018-01-16 DIAGNOSIS — Z0000 Encounter for general adult medical examination without abnormal findings: Secondary | ICD-10-CM | POA: Insufficient documentation

## 2018-01-16 DIAGNOSIS — Z87440 Personal history of urinary (tract) infections: Secondary | ICD-10-CM

## 2018-01-16 LAB — POCT URINALYSIS DIPSTICK
Bilirubin, UA: NEGATIVE
Glucose, UA: NEGATIVE
KETONES UA: NEGATIVE
Nitrite, UA: NEGATIVE
Protein, UA: NEGATIVE
Spec Grav, UA: 1.02 (ref 1.010–1.025)
UROBILINOGEN UA: 0.2 U/dL
pH, UA: 6.5 (ref 5.0–8.0)

## 2018-01-16 NOTE — Patient Instructions (Signed)
It was a pleasure to see you today. Work on diet and exercise. RTC one year. Prevnar 13 given.

## 2018-01-16 NOTE — Progress Notes (Signed)
Subjective:    Patient ID: Carla Collins, female    DOB: Jul 20, 1951, 67 y.o.   MRN: [id number]  HPI 67 year old Black Female for Welcome to Medicare physical exam and evaluation of medical issues.  BMI discussed. Walking more recently.Neck pain resolved after mother's passing.  Has GYN but will do Pap of cuff today and palpate ovaries.  Prevnar 13 given.  Prescription given for Shingrix vaccine.  Labs reviewed.  Has mild elevation of LDL cholesterol, BUN and creatinine are normal.  CBC is normal.  Liver functions are normal.  TSH is normal.  She has a history of stress and urge urinary incontinence.  She takes Crestor for hyperlipidemia.  There is a family history of breast cancer in first-degree relative and also her mother died with dementia complications.  She has a history of vitamin D deficiency.  Has frequent urinary infections.  History of migraine headaches.  Takes Flexeril for musculoskeletal pain.  Since mother died her neck pain has improved and she is sleeping better.  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  She had a D&C in 2000.  Subsequently had hysterectomy.  Had colonoscopy in 2007 10-year follow-up recommended.  Needs follow-up.  Social history: She is married.  She has a Scientist, water quality.  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  Does not smoke or consume alcohol.  One daughter.  Family history: Mother was diagnosed with breast cancer at the age of 56 with history of dementia and hypertension.  Father with history of MI.  One sister alive and well.  Patient has history of vitamin D deficiency but this was not checked with Medicare physical exam as it is not covered by Medicare.      Review of Systems no new complaints     Objective:   Physical Exam  Constitutional: She is oriented to person, place, and time. She appears well-developed and well-nourished.  HENT:  Head: Normocephalic.  Right Ear: External ear  normal.  Left Ear: External ear normal.  Mouth/Throat: Oropharynx is clear and moist.  Eyes: EOM are normal. Right eye exhibits no discharge. Left eye exhibits no discharge. No scleral icterus.  Neck: No JVD present. No thyromegaly present.  Cardiovascular: Normal rate, regular rhythm, normal heart sounds and intact distal pulses. Exam reveals no gallop.  No murmur heard. Pulmonary/Chest: Breath sounds normal. No stridor. No respiratory distress. She has no wheezes. She has no rales.  Abdominal: Soft. Bowel sounds are normal. She exhibits no distension and no mass. There is no tenderness. There is no rebound and no guarding.  Genitourinary:  Genitourinary Comments: Pap taken of vaginal cuff.  Bimanual normal.  Musculoskeletal: She exhibits no edema.  Lymphadenopathy:    She has no cervical adenopathy.  Neurological: She is alert and oriented to person, place, and time. She displays normal reflexes. No cranial nerve deficit or sensory deficit. She exhibits normal muscle tone. Coordination normal.  Skin: Skin is warm and dry.  Psychiatric: She has a normal mood and affect. Her behavior is normal. Judgment and thought content normal.  Vitals reviewed.         Assessment & Plan:  Musculoskeletal pain/neck pain treated with as needed Flexeril  Hyperlipidemia treated with Crestor  BMI 29.44-work on diet, exercise and weight loss  History of urinary tract infections  History of vitamin D deficiency-take 2000 units vitamin D3 daily  Plan: Return in 1 year or as needed.  Subjective:  Patient presents for Medicare Annual/Subsequent preventive examination.  Review Past Medical/Family/Social: See above   Risk Factors  Current exercise habits: Walks some Dietary issues discussed: Low-fat low carbohydrate  Cardiac risk factors: Hyperlipidemia  Depression Screen  (Note: if answer to either of the following is "Yes", a more complete depression screening is indicated)   Over the  past two weeks, have you felt down, depressed or hopeless? No  Over the past two weeks, have you felt little interest or pleasure in doing things? No Have you lost interest or pleasure in daily life? No Do you often feel hopeless? No Do you cry easily over simple problems? No   Activities of Daily Living  In your present state of health, do you have any difficulty performing the following activities?:   Driving? No  Managing money? No  Feeding yourself? No  Getting from bed to chair? No  Climbing a flight of stairs? No  Preparing food and eating?: No  Bathing or showering? No  Getting dressed: No  Getting to the toilet? No  Using the toilet:No  Moving around from place to place: No  In the past year have you fallen or had a near fall?:No  Are you sexually active? yes Do you have more than one partner? No   Hearing Difficulties: No  Do you often ask people to speak up or repeat themselves? No  Do you experience ringing or noises in your ears? No  Do you have difficulty understanding soft or whispered voices? No  Do you feel that you have a problem with memory? No Do you often misplace items? No    Home Safety:  Do you have a smoke alarm at your residence? Yes Do you have grab bars in the bathroom?  No Do you have throw rugs in your house?  Yes   Cognitive Testing  Alert? Yes Normal Appearance?Yes  Oriented to person? Yes Place? Yes  Time? Yes  Recall of three objects? Yes  Can perform simple calculations? Yes  Displays appropriate judgment?Yes  Can read the correct time from a watch face?Yes   List the Names of Other Physician/Practitioners you currently use:  See referral list for the physicians patient is currently seeing.     Review of Systems: See above  Objective:     General appearance: Appears younger than stated age and mildly obese  Head: Normocephalic, without obvious abnormality, atraumatic  Eyes: conj clear, EOMi PEERLA  Ears: normal TM's and  external ear canals both ears  Nose: Nares normal. Septum midline. Mucosa normal. No drainage or sinus tenderness.  Throat: lips, mucosa, and tongue normal; teeth and gums normal  Neck: no adenopathy, no carotid bruit, no JVD, supple, symmetrical, trachea midline and thyroid not enlarged, symmetric, no tenderness/mass/nodules  No CVA tenderness.  Lungs: clear to auscultation bilaterally  Breasts: normal appearance, no masses or tenderness Heart: regular rate and rhythm, S1, S2 normal, no murmur, click, rub or gallop  Abdomen: soft, non-tender; bowel sounds normal; no masses, no organomegaly  Musculoskeletal: ROM normal in all joints, no crepitus, no deformity, Normal muscle strengthen. Back  is symmetric, no curvature. Skin: Skin color, texture, turgor normal. No rashes or lesions  Lymph nodes: Cervical, supraclavicular, and axillary nodes normal.  Neurologic: CN 2 -12 Normal, Normal symmetric reflexes. Normal coordination and gait  Psych: Alert & Oriented x 3, Mood appear stable.    Assessment:    Annual wellness medicare exam   Plan:    During the course of  the visit the patient was educated and counseled about appropriate screening and preventive services including:   Prevnar 13 given     Patient Instructions (the written plan) was given to the patient.  Medicare Attestation  I have personally reviewed:  The patient's medical and social history  Their use of alcohol, tobacco or illicit drugs  Their current medications and supplements  The patient's functional ability including ADLs,fall risks, home safety risks, cognitive, and hearing and visual impairment  Diet and physical activities  Evidence for depression or mood disorders  The patient's weight, height, BMI, and visual acuity have been recorded in the chart. I have made referrals, counseling, and provided education to the patient based on review of the above and I have provided the patient with a written personalized  care plan for preventive services.

## 2018-01-17 LAB — URINE CULTURE
MICRO NUMBER: 90585982
SPECIMEN QUALITY: ADEQUATE

## 2018-01-19 DIAGNOSIS — H2513 Age-related nuclear cataract, bilateral: Secondary | ICD-10-CM | POA: Diagnosis not present

## 2018-01-19 LAB — CYTOLOGY - PAP: Diagnosis: NEGATIVE

## 2018-01-25 ENCOUNTER — Other Ambulatory Visit: Payer: Self-pay | Admitting: Internal Medicine

## 2018-01-30 ENCOUNTER — Encounter: Payer: Self-pay | Admitting: Internal Medicine

## 2018-02-12 ENCOUNTER — Encounter: Payer: Self-pay | Admitting: Internal Medicine

## 2018-02-12 ENCOUNTER — Ambulatory Visit (INDEPENDENT_AMBULATORY_CARE_PROVIDER_SITE_OTHER): Payer: Medicare Other | Admitting: Internal Medicine

## 2018-02-12 VITALS — BP 102/78 | HR 88 | Temp 98.1°F | Ht 64.0 in | Wt 189.0 lb

## 2018-02-12 DIAGNOSIS — T50A95A Adverse effect of other bacterial vaccines, initial encounter: Secondary | ICD-10-CM | POA: Diagnosis not present

## 2018-02-12 DIAGNOSIS — E2839 Other primary ovarian failure: Secondary | ICD-10-CM

## 2018-02-12 DIAGNOSIS — Z1211 Encounter for screening for malignant neoplasm of colon: Secondary | ICD-10-CM

## 2018-02-12 MED ORDER — TRIAMCINOLONE ACETONIDE 0.1 % EX CREA: 1.0000 "application " | TOPICAL_CREAM | Freq: Two times a day (BID) | CUTANEOUS | 0 refills | Status: DC

## 2018-02-12 MED ORDER — TRIAMCINOLONE 0.1 % CREAM:EUCERIN CREAM 1:1: 1.0000 "application " | TOPICAL_CREAM | Freq: Three times a day (TID) | CUTANEOUS | 0 refills | Status: DC

## 2018-02-12 NOTE — Patient Instructions (Addendum)
Triamcinolone 30 g 0.1% cream mixed with 4 ounces of Eucerin cream to apply to rash 3 times daily until resolved.  Referral made for screening colonoscopy.

## 2018-02-26 NOTE — Progress Notes (Signed)
   Subjective:    Patient ID: Carla Collins, female    DOB: Jun 09, 1951, 67 y.o.   MRN: [id number]  HPI Recently had welcome to Medicare preventative visit and was given Prevnar 13 vaccine.  Shortly after that within a day or so ,she developed a rash.  Rash is not itchy.  Apparently this has been reported in greater than 10% of patients according to Up-to-Date.  Rash is mainly on her arms.    Review of Systems see above     Objective:   Physical Exam  She has a fine papular rash on her arms.  No evidence of secondary infection.      Assessment & Plan:  Rash-likely secondary to Prevnar 13  Plan: Patient requesting referral for colonoscopy.  Prescribed triamcinolone cream 30 g 0.1% mixed with 4 ounces of Eucerin cream 3 times daily.  Order placed for bone density study

## 2018-04-17 ENCOUNTER — Encounter: Payer: Self-pay | Admitting: Internal Medicine

## 2018-04-18 ENCOUNTER — Telehealth: Payer: Self-pay | Admitting: Internal Medicine

## 2018-04-18 NOTE — Telephone Encounter (Signed)
Patient came in the office today to pick up an order for a mammogram.  Spoke with patient about not scheduling with [facility] for colonoscopy or returning their calls.  Patient states that she hasn't received any messages from their office.  Handed her the copy of the letter from [facility] to Dr. Renold Genta stating that patient had been contacted but had not responded to their contact and had not made any return calls.  Patient stated she would call [facility] today and make arrangements for setting up an appointment for a GI Consult for Colonoscopy.

## 2018-04-19 ENCOUNTER — Encounter: Payer: Self-pay | Admitting: Internal Medicine

## 2018-04-19 ENCOUNTER — Encounter: Payer: Self-pay | Admitting: Gastroenterology

## 2018-04-19 DIAGNOSIS — N644 Mastodynia: Secondary | ICD-10-CM | POA: Diagnosis not present

## 2018-04-19 DIAGNOSIS — R928 Other abnormal and inconclusive findings on diagnostic imaging of breast: Secondary | ICD-10-CM | POA: Diagnosis not present

## 2018-04-19 DIAGNOSIS — Z803 Family history of malignant neoplasm of breast: Secondary | ICD-10-CM | POA: Diagnosis not present

## 2018-04-26 ENCOUNTER — Other Ambulatory Visit: Payer: Self-pay | Admitting: Internal Medicine

## 2018-05-14 DIAGNOSIS — H00014 Hordeolum externum left upper eyelid: Secondary | ICD-10-CM | POA: Diagnosis not present

## 2018-06-04 ENCOUNTER — Ambulatory Visit (AMBULATORY_SURGERY_CENTER): Payer: Self-pay

## 2018-06-04 VITALS — Ht 67.0 in | Wt 192.2 lb

## 2018-06-04 DIAGNOSIS — Z1211 Encounter for screening for malignant neoplasm of colon: Secondary | ICD-10-CM

## 2018-06-04 NOTE — Progress Notes (Signed)
Denies allergies to eggs or soy products. Denies complication of anesthesia or sedation. Denies use of weight loss medication. Denies use of O2.   Emmi instructions declined.  

## 2018-06-18 ENCOUNTER — Ambulatory Visit (AMBULATORY_SURGERY_CENTER): Payer: Medicare Other | Admitting: Gastroenterology

## 2018-06-18 ENCOUNTER — Encounter: Payer: Self-pay | Admitting: Gastroenterology

## 2018-06-18 VITALS — BP 116/83 | HR 71 | Temp 97.5°F | Resp 10 | Ht 67.0 in | Wt 192.0 lb

## 2018-06-18 DIAGNOSIS — D125 Benign neoplasm of sigmoid colon: Secondary | ICD-10-CM

## 2018-06-18 DIAGNOSIS — Z1211 Encounter for screening for malignant neoplasm of colon: Secondary | ICD-10-CM

## 2018-06-18 DIAGNOSIS — K5289 Other specified noninfective gastroenteritis and colitis: Secondary | ICD-10-CM

## 2018-06-18 DIAGNOSIS — E7800 Pure hypercholesterolemia, unspecified: Secondary | ICD-10-CM | POA: Diagnosis not present

## 2018-06-18 DIAGNOSIS — K529 Noninfective gastroenteritis and colitis, unspecified: Secondary | ICD-10-CM | POA: Diagnosis not present

## 2018-06-18 DIAGNOSIS — K641 Second degree hemorrhoids: Secondary | ICD-10-CM

## 2018-06-18 DIAGNOSIS — Z86010 Personal history of colonic polyps: Secondary | ICD-10-CM | POA: Diagnosis not present

## 2018-06-18 MED ORDER — SODIUM CHLORIDE 0.9 % IV SOLN: 500.0000 mL | Freq: Once | INTRAVENOUS | Status: DC

## 2018-06-18 NOTE — Progress Notes (Signed)
Report given to PACU, vss 

## 2018-06-18 NOTE — Patient Instructions (Signed)
**   Handout given on polyps and hemorrhoid banding **   YOU HAD AN ENDOSCOPIC PROCEDURE TODAY AT [facility]:   Refer to the procedure report that was given to you for any specific questions about what was found during the examination.  If the procedure report does not answer your questions, please call your gastroenterologist to clarify.  If you requested that your care partner not be given the details of your procedure findings, then the procedure report has been included in a sealed envelope for you to review at your convenience later.  YOU SHOULD EXPECT: Some feelings of bloating in the abdomen. Passage of more gas than usual.  Walking can help get rid of the air that was put into your GI tract during the procedure and reduce the bloating. If you had a lower endoscopy (such as a colonoscopy or flexible sigmoidoscopy) you may notice spotting of blood in your stool or on the toilet paper. If you underwent a bowel prep for your procedure, you may not have a normal bowel movement for a few days.  Please Note:  You might notice some irritation and congestion in your nose or some drainage.  This is from the oxygen used during your procedure.  There is no need for concern and it should clear up in a day or so.  SYMPTOMS TO REPORT IMMEDIATELY:   Following lower endoscopy (colonoscopy or flexible sigmoidoscopy):  Excessive amounts of blood in the stool  Significant tenderness or worsening of abdominal pains  Swelling of the abdomen that is new, acute  Fever of 100F or higher  For urgent or emergent issues, a gastroenterologist can be reached at any hour by calling [phone].   DIET:  We do recommend a small meal at first, but then you may proceed to your regular diet.  Drink plenty of fluids but you should avoid alcoholic beverages for 24 hours.  ACTIVITY:  You should plan to take it easy for the rest of today and you should NOT DRIVE or use heavy machinery until tomorrow  (because of the sedation medicines used during the test).    FOLLOW UP: Our staff will call the number listed on your records the next business day following your procedure to check on you and address any questions or concerns that you may have regarding the information given to you following your procedure. If we do not reach you, we will leave a message.  However, if you are feeling well and you are not experiencing any problems, there is no need to return our call.  We will assume that you have returned to your regular daily activities without incident.  If any biopsies were taken you will be contacted by phone or by letter within the next 1-3 weeks.  Please call us at [phone] if you have not heard about the biopsies in 3 weeks.    SIGNATURES/CONFIDENTIALITY: You and/or your care partner have signed paperwork which will be entered into your electronic medical record.  These signatures attest to the fact that that the information above on your After Visit Summary has been reviewed and is understood.  Full responsibility of the confidentiality of this discharge information lies with you and/or your care-partner.

## 2018-06-18 NOTE — Progress Notes (Signed)
Called to room to assist during endoscopic procedure.  Patient ID and intended procedure confirmed with present staff. Received instructions for my participation in the procedure from the performing physician.  

## 2018-06-18 NOTE — Op Note (Signed)
[facility] Patient Name: Carla Collins Procedure Date: 06/18/2018 10:56 AM MRN: [id number] Endoscopist: Gerrit Heck , MD Age: 67 Referring MD:  Date of Birth: [birth year] Gender: Female Account #: [id number] Procedure:                Colonoscopy Indications:              Screening for colorectal malignant neoplasm. Last                            colonoscopy was more than 10 years ago (2007; Dr.                            Olevia Perches) and only notable for internal hemorrhoids.                            She is otherwise without GI symptoms and no known                            family history of CRC. Medicines:                Monitored Anesthesia Care Procedure:                Pre-Anesthesia Assessment:                           - Prior to the procedure, a History and Physical                            was performed, and patient medications and                            allergies were reviewed. The patient's tolerance of                            previous anesthesia was also reviewed. The risks                            and benefits of the procedure and the sedation                            options and risks were discussed with the patient.                            All questions were answered, and informed consent                            was obtained. Prior Anticoagulants: The patient has                            taken no previous anticoagulant or antiplatelet                            agents. ASA Grade Assessment: II - A patient with  mild systemic disease. After reviewing the risks                            and benefits, the patient was deemed in                            satisfactory condition to undergo the procedure.                           After obtaining informed consent, the colonoscope                            was passed under direct vision. Throughout the                            procedure, the patient's blood  pressure, pulse, and                            oxygen saturations were monitored continuously. The                            Colonoscope was introduced through the anus and                            advanced to the the terminal ileum. The colonoscopy                            was performed without difficulty. The patient                            tolerated the procedure well. The quality of the                            bowel preparation was adequate. Scope In: 11:03:14 AM Scope Out: 11:23:13 AM Scope Withdrawal Time: 0 hours 16 minutes 7 seconds  Total Procedure Duration: 0 hours 19 minutes 59 seconds  Findings:                 Hemorrhoids were found on perianal exam.                           Two sessile polyps were found in the sigmoid colon.                            The polyps were 2 to 4 mm in size. The smaller                            polyp was removed with cold forceps and the larger                            polyp was removed with a cold snare. Resection and                            retrieval  were complete. Estimated blood loss was                            minimal.                           A 6 mm polyp was found in the sigmoid colon. The                            polyp was sessile. The polyp was removed with a                            cold snare. Resection and retrieval were complete.                            Estimated blood loss was minimal.                           Non-bleeding internal hemorrhoids were found during                            retroflexion. The hemorrhoids were medium-sized.                           A localized area of mildly erythematous mucosa was                            found at the ileocecal valve. This was biopsied                            with a cold forceps for histology. Estimated blood                            loss was minimal.                           The terminal ileum appeared otherwise normal to 8                             cm from the ileocecal valve. Complications:            No immediate complications. Estimated Blood Loss:     Estimated blood loss was minimal. Impression:               - Hemorrhoids found on perianal exam.                           - Two 2 to 4 mm polyps in the sigmoid colon,                            removed with a cold snare adn cold forceps.                            Resected and retrieved.                           -  One 6 mm polyp in the sigmoid colon, removed with                            a cold snare. Resected and retrieved.                           - Non-bleeding internal hemorrhoids.                           - Erythematous mucosa at the ileocecal valve.                            Biopsied.                           - The examined portion of the ileum was normal. Recommendation:           - Patient has a contact number available for                            emergencies. The signs and symptoms of potential                            delayed complications were discussed with the                            patient. Return to normal activities tomorrow.                            Written discharge instructions were provided to the                            patient.                           - Resume previous diet today.                           - Continue present medications.                           - Await pathology results.                           - Repeat colonoscopy in 3 - 5 years for                            surveillance based on pathology results.                           - Return to GI clinic PRN.                           - Internal hemorrhoids were noted on this study and  may be amenable to hemorrhoid band ligation. If you                            are interested in further treatment of these                            hemorrhoids with band ligation, please contact my                            clinic to set up an  appointment for evaluation and                            treatment. Gerrit Heck, MD 06/18/2018 11:30:53 AM

## 2018-06-19 ENCOUNTER — Telehealth: Payer: Self-pay

## 2018-06-19 NOTE — Telephone Encounter (Signed)
NO ANSWER, MESSAGE LEFT FOR PATIENT. 

## 2018-06-19 NOTE — Telephone Encounter (Signed)
  Follow up Call-  Call back number 06/18/2018  Post procedure Call Back phone  # [phone]  Permission to leave phone message Yes  Some recent data might be hidden     Patient questions:  Do you have a fever, pain , or abdominal swelling? No. Pain Score  0 *  Have you tolerated food without any problems? Yes.    Have you been able to return to your normal activities? Yes.    Do you have any questions about your discharge instructions: Diet   No. Medications  No. Follow up visit  No.  Do you have questions or concerns about your Care? No.  Actions: * If pain score is 4 or above: No action needed, pain <4.

## 2018-06-22 ENCOUNTER — Encounter: Payer: Self-pay | Admitting: Gastroenterology

## 2018-07-03 DIAGNOSIS — Z23 Encounter for immunization: Secondary | ICD-10-CM | POA: Diagnosis not present

## 2018-08-16 ENCOUNTER — Ambulatory Visit (INDEPENDENT_AMBULATORY_CARE_PROVIDER_SITE_OTHER): Payer: Medicare Other | Admitting: Internal Medicine

## 2018-08-16 ENCOUNTER — Encounter: Payer: Self-pay | Admitting: Internal Medicine

## 2018-08-16 VITALS — BP 102/70 | HR 80 | Temp 98.1°F | Ht 64.0 in | Wt 194.0 lb

## 2018-08-16 DIAGNOSIS — H10029 Other mucopurulent conjunctivitis, unspecified eye: Secondary | ICD-10-CM | POA: Diagnosis not present

## 2018-08-16 DIAGNOSIS — J329 Chronic sinusitis, unspecified: Secondary | ICD-10-CM | POA: Diagnosis not present

## 2018-08-16 DIAGNOSIS — H1033 Unspecified acute conjunctivitis, bilateral: Secondary | ICD-10-CM | POA: Diagnosis not present

## 2018-08-16 MED ORDER — OFLOXACIN 0.3 % OP SOLN: 2.0000 [drp] | Freq: Four times a day (QID) | OPHTHALMIC | 0 refills | Status: DC

## 2018-08-16 MED ORDER — METHYLPREDNISOLONE ACETATE 80 MG/ML IJ SUSP
80.0000 mg | Freq: Once | INTRAMUSCULAR | Status: AC
  Administered 2018-08-16: 80 mg via INTRAMUSCULAR

## 2018-08-16 MED ORDER — FLUCONAZOLE 150 MG PO TABS: 150.0000 mg | ORAL_TABLET | Freq: Once | ORAL | 1 refills | Status: AC

## 2018-08-16 MED ORDER — CLARITHROMYCIN 500 MG PO TABS: 500.0000 mg | ORAL_TABLET | Freq: Two times a day (BID) | ORAL | 0 refills | Status: DC

## 2018-08-16 NOTE — Patient Instructions (Signed)
Depo-Medrol 80 mg IM .  Biaxin 500 mg twice daily for 10 days.  Ocuflox ophthalmic drops 2 drops in each eye 4 times a day for 5 days.

## 2018-08-16 NOTE — Progress Notes (Signed)
   Subjective:    Patient ID: Carla Collins, female    DOB: Mar 21, 1951, 67 y.o.   MRN: [id number]  HPI 67 year old Black Female in today with respiratory symptoms.  Has sinus pressure and headache.  Has noticed some drainage from both eyes.  No fever or shaking chills.  Some discolored nasal drainage.  No significant cough.    Review of Systems see above     Objective:   Physical Exam Vital signs reviewed.  Skin warm and dry.  Pharynx noninjected.  TMs are full bilaterally but not red.  Neck is supple without adenopathy.  Chest clear to auscultation.  Conjunctivae are injected bilaterally without significant drainage       Assessment & Plan:  Acute serous otitis media  Acute maxillary sinusitis  Bilateral conjunctivitis  Plan: Ocuflox ophthalmic drops 2 drops in each eye 4 times a day for 5 days.  Biaxin 500 mg twice daily for 10 days.

## 2018-10-01 ENCOUNTER — Ambulatory Visit (INDEPENDENT_AMBULATORY_CARE_PROVIDER_SITE_OTHER): Payer: Medicare Other | Admitting: Internal Medicine

## 2018-10-01 ENCOUNTER — Encounter: Payer: Self-pay | Admitting: Internal Medicine

## 2018-10-01 VITALS — BP 102/80 | HR 80 | Temp 98.3°F | Ht 64.0 in | Wt 195.0 lb

## 2018-10-01 DIAGNOSIS — R0981 Nasal congestion: Secondary | ICD-10-CM | POA: Diagnosis not present

## 2018-10-01 DIAGNOSIS — R509 Fever, unspecified: Secondary | ICD-10-CM

## 2018-10-01 LAB — CBC WITH DIFFERENTIAL/PLATELET
ABSOLUTE MONOCYTES: 793 {cells}/uL (ref 200–950)
BASOS PCT: 0.5 %
Basophils Absolute: 39 cells/uL (ref 0–200)
EOS ABS: 39 {cells}/uL (ref 15–500)
Eosinophils Relative: 0.5 %
HCT: 43.5 % (ref 35.0–45.0)
Hemoglobin: 14.6 g/dL (ref 11.7–15.5)
Lymphs Abs: 2118 cells/uL (ref 850–3900)
MCH: 30 pg (ref 27.0–33.0)
MCHC: 33.6 g/dL (ref 32.0–36.0)
MCV: 89.5 fL (ref 80.0–100.0)
MPV: 11.1 fL (ref 7.5–12.5)
Monocytes Relative: 10.3 %
Neutro Abs: 4712 cells/uL (ref 1500–7800)
Neutrophils Relative %: 61.2 %
Platelets: 263 10*3/uL (ref 140–400)
RBC: 4.86 10*6/uL (ref 3.80–5.10)
RDW: 13.2 % (ref 11.0–15.0)
TOTAL LYMPHOCYTE: 27.5 %
WBC: 7.7 10*3/uL (ref 3.8–10.8)

## 2018-10-01 LAB — POCT INFLUENZA A/B
Influenza A, POC: NEGATIVE
Influenza B, POC: NEGATIVE

## 2018-10-01 MED ORDER — HYDROCODONE-ACETAMINOPHEN 5-325 MG PO TABS: 1.0000 | ORAL_TABLET | Freq: Four times a day (QID) | ORAL | 0 refills | Status: DC | PRN

## 2018-10-01 MED ORDER — DOXYCYCLINE HYCLATE 100 MG PO TABS: 100.0000 mg | ORAL_TABLET | Freq: Two times a day (BID) | ORAL | 0 refills | Status: DC

## 2018-10-01 NOTE — Progress Notes (Signed)
   Subjective:    Patient ID: MELISS FLEEK, female    DOB: Nov 11, 1950, 68 y.o.   MRN: [id number]  HPI 68 year old Female who had flu vaccine earlier in the year is in today with nasal congestion frontal headache and ear pain.  Denies shaking chills.  Says she has felt feverish.  Today is afebrile.    Review of Systems no nausea or vomiting.  No myalgias.  Main complaint today is headache     Objective:   Physical Exam Temperature 98.3 degrees pulse 80, pulse oximetry 97%.  Rapid flu test is negative.  CBC with differential drawn and pending.  Neck is supple without adenopathy.  Kernig's and Brudzinski signs are negative.  She has no focal deficits on brief neurological exam.  Pharynx is slightly injected.  Both TMs are full bilaterally and slightly red.  Neck is supple.  Chest clear to auscultation without rales or wheezing.       Assessment & Plan:  Acute respiratory infection  Frontal headache  Bilateral otitis media  Plan: CBC with differential pending.  Doxycycline 100 mg twice daily for 10 days.  Norco 5/325 sparingly every 6 hours as needed headache and ear pain.  Rest and drink plenty of fluids.  Call if not better in 24 to 48 hours or sooner if worse.

## 2018-11-19 ENCOUNTER — Other Ambulatory Visit: Payer: Self-pay

## 2018-11-19 ENCOUNTER — Ambulatory Visit (INDEPENDENT_AMBULATORY_CARE_PROVIDER_SITE_OTHER): Payer: Medicare Other | Admitting: Internal Medicine

## 2018-11-19 VITALS — BP 110/80 | HR 100 | Temp 98.4°F | Ht 64.0 in | Wt 195.0 lb

## 2018-11-19 DIAGNOSIS — L739 Follicular disorder, unspecified: Secondary | ICD-10-CM

## 2018-11-19 DIAGNOSIS — J22 Unspecified acute lower respiratory infection: Secondary | ICD-10-CM | POA: Diagnosis not present

## 2018-11-19 MED ORDER — HYDROCODONE-HOMATROPINE 5-1.5 MG/5ML PO SYRP: 5.0000 mL | ORAL_SOLUTION | Freq: Three times a day (TID) | ORAL | 0 refills | Status: DC | PRN

## 2018-11-19 MED ORDER — TRIAMCINOLONE ACETONIDE 0.1 % EX CREA: TOPICAL_CREAM | CUTANEOUS | 1 refills | Status: DC

## 2018-11-19 MED ORDER — LEVOFLOXACIN 500 MG PO TABS: 500.0000 mg | ORAL_TABLET | Freq: Every day | ORAL | 0 refills | Status: DC

## 2018-12-03 ENCOUNTER — Encounter: Payer: Self-pay | Admitting: Internal Medicine

## 2018-12-03 NOTE — Progress Notes (Signed)
   Subjective:    Patient ID: Carla Collins, female    DOB: [birth year], 68 y.o.   MRN: [id number]  HPI 68 year old Female in today with complaint of cough.  No fever.  Cough is productive.  No travel history.  No flulike symptoms.  No myalgias nausea vomiting diarrhea.    Review of Systems see above-complains of scratchy throat.  She also has developed a patch of folliculitis will be treated with triamcinolone cream.     Objective:   Physical Exam Signs reviewed.  She is afebrile.  Skin warm and dry.  No anterior cervical nodes.  Pharynx is clear.  TMs are clear.  Neck is supple.  Chest is clear to auscultation without rales or wheezing.  Rapid flu test is negative.  CBC with differential is normal with white blood cell count 7700.       Assessment & Plan:  Acute lower respiratory infection  Folliculitis  Plan: Levaquin 500 mg daily for 7 days.  Hycodan 1 teaspoon p.o. every 8 hours as needed cough.  Rest and drink plenty of fluids.  Triamcinolone cream applied to folliculitis 2 or 3 times a day until healed

## 2018-12-03 NOTE — Patient Instructions (Signed)
Levaquin 500 mg daily for 10 days.  Hycodan 1 teaspoon p.o. every 8 hours as needed cough.  Rest and drink plenty of fluids.  For folliculitis use triamcinolone cream 3 times a day until resolved.

## 2019-01-01 ENCOUNTER — Telehealth: Payer: Self-pay | Admitting: Internal Medicine

## 2019-01-01 NOTE — Telephone Encounter (Signed)
LVM To CB and schedule CPE, AWV and Labs for after 01/17/2019

## 2019-04-05 ENCOUNTER — Other Ambulatory Visit: Payer: Self-pay

## 2019-04-29 ENCOUNTER — Other Ambulatory Visit: Payer: Medicare Other | Admitting: Internal Medicine

## 2019-04-29 ENCOUNTER — Other Ambulatory Visit: Payer: Self-pay

## 2019-04-29 DIAGNOSIS — Z0000 Encounter for general adult medical examination without abnormal findings: Secondary | ICD-10-CM

## 2019-04-29 DIAGNOSIS — E785 Hyperlipidemia, unspecified: Secondary | ICD-10-CM | POA: Diagnosis not present

## 2019-04-30 LAB — CBC WITH DIFFERENTIAL/PLATELET
Absolute Monocytes: 354 cells/uL (ref 200–950)
Basophils Absolute: 61 cells/uL (ref 0–200)
Basophils Relative: 1 %
Eosinophils Absolute: 73 cells/uL (ref 15–500)
Eosinophils Relative: 1.2 %
HCT: 43.8 % (ref 35.0–45.0)
Hemoglobin: 14.8 g/dL (ref 11.7–15.5)
Lymphs Abs: 3093 cells/uL (ref 850–3900)
MCH: 30.8 pg (ref 27.0–33.0)
MCHC: 33.8 g/dL (ref 32.0–36.0)
MCV: 91.1 fL (ref 80.0–100.0)
MPV: 11.7 fL (ref 7.5–12.5)
Monocytes Relative: 5.8 %
Neutro Abs: 2519 cells/uL (ref 1500–7800)
Neutrophils Relative %: 41.3 %
Platelets: 261 10*3/uL (ref 140–400)
RBC: 4.81 10*6/uL (ref 3.80–5.10)
RDW: 13.4 % (ref 11.0–15.0)
Total Lymphocyte: 50.7 %
WBC: 6.1 10*3/uL (ref 3.8–10.8)

## 2019-04-30 LAB — COMPLETE METABOLIC PANEL WITH GFR
AG Ratio: 1.2 (calc) (ref 1.0–2.5)
ALT: 12 U/L (ref 6–29)
AST: 19 U/L (ref 10–35)
Albumin: 4.1 g/dL (ref 3.6–5.1)
Alkaline phosphatase (APISO): 95 U/L (ref 37–153)
BUN: 16 mg/dL (ref 7–25)
CO2: 25 mmol/L (ref 20–32)
Calcium: 9.3 mg/dL (ref 8.6–10.4)
Chloride: 108 mmol/L (ref 98–110)
Creat: 0.88 mg/dL (ref 0.50–0.99)
GFR, Est African American: 79 mL/min/{1.73_m2} (ref >=60)
GFR, Est Non African American: 68 mL/min/{1.73_m2} (ref >=60)
Globulin: 3.3 g/dL (calc) (ref 1.9–3.7)
Glucose, Bld: 85 mg/dL (ref 65–99)
Potassium: 4.3 mmol/L (ref 3.5–5.3)
Sodium: 142 mmol/L (ref 135–146)
Total Bilirubin: 0.6 mg/dL (ref 0.2–1.2)
Total Protein: 7.4 g/dL (ref 6.1–8.1)

## 2019-04-30 LAB — LIPID PANEL
Cholesterol: 200 mg/dL — ABNORMAL HIGH (ref <200)
HDL: 65 mg/dL (ref >=50)
LDL Cholesterol (Calc): 118 mg/dL (calc) — ABNORMAL HIGH
Non-HDL Cholesterol (Calc): 135 mg/dL (calc) — ABNORMAL HIGH (ref <130)
Total CHOL/HDL Ratio: 3.1 (calc) (ref <5.0)
Triglycerides: 75 mg/dL (ref <150)

## 2019-04-30 LAB — TSH: TSH: 2.18 mIU/L (ref 0.40–4.50)

## 2019-04-30 NOTE — Telephone Encounter (Signed)
Appointment scheduled.

## 2019-05-03 ENCOUNTER — Ambulatory Visit (INDEPENDENT_AMBULATORY_CARE_PROVIDER_SITE_OTHER): Payer: Medicare Other | Admitting: Internal Medicine

## 2019-05-03 ENCOUNTER — Other Ambulatory Visit: Payer: Self-pay

## 2019-05-03 ENCOUNTER — Encounter: Payer: Self-pay | Admitting: Internal Medicine

## 2019-05-03 VITALS — BP 110/80 | HR 83 | Ht 64.0 in | Wt 189.0 lb

## 2019-05-03 DIAGNOSIS — R8290 Unspecified abnormal findings in urine: Secondary | ICD-10-CM | POA: Diagnosis not present

## 2019-05-03 DIAGNOSIS — Z860101 Personal history of adenomatous and serrated colon polyps: Secondary | ICD-10-CM

## 2019-05-03 DIAGNOSIS — E785 Hyperlipidemia, unspecified: Secondary | ICD-10-CM

## 2019-05-03 DIAGNOSIS — Z86010 Personal history of colonic polyps: Secondary | ICD-10-CM | POA: Diagnosis not present

## 2019-05-03 DIAGNOSIS — Z23 Encounter for immunization: Secondary | ICD-10-CM | POA: Diagnosis not present

## 2019-05-03 DIAGNOSIS — E559 Vitamin D deficiency, unspecified: Secondary | ICD-10-CM

## 2019-05-03 DIAGNOSIS — Z0000 Encounter for general adult medical examination without abnormal findings: Secondary | ICD-10-CM

## 2019-05-03 LAB — POCT URINALYSIS DIPSTICK
Appearance: NEGATIVE
Bilirubin, UA: NEGATIVE
Blood, UA: NEGATIVE
Glucose, UA: NEGATIVE
Ketones, UA: NEGATIVE
Nitrite, UA: NEGATIVE
Odor: NEGATIVE
Protein, UA: NEGATIVE
Spec Grav, UA: 1.01 (ref 1.010–1.025)
Urobilinogen, UA: 0.2 E.U./dL
pH, UA: 6.5 (ref 5.0–8.0)

## 2019-05-03 NOTE — Progress Notes (Signed)
Subjective:    Patient ID: Carla Collins, female    DOB: 09-14-1950, 68 y.o.   MRN: [id number]  HPI 68 year old Female for health maintenance exam, Initial Medicare wellness exam, and evaluation of medical issues.  Patient has a history of hyperlipidemia treated with Crestor.  Flu vaccine given today.  Needs pneumococcal 23 in the near future.  This has been booked for September 18.  She has a history of stress and urge urinary incontinence.  She has a history of vitamin D deficiency and migraine headaches.  Has frequent urinary infections.  Takes Flexeril for musculoskeletal pain.  Used to have a lot of neck pain when her mother was in poor health.  This is improved and she is sleeping better.  Family history: Mother died of dementia complications.  There is a family history of breast cancer in first-degree relative.  Father with history of MI.  One sister alive and well.  Mother had history of hypertension and died at age 44 with complications of breast cancer and dementia.  Was diagnosed with breast cancer at age 52.  Social history: She is married.  She has a Scientist, water quality.  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  Does not smoke or consume alcohol.  One daughter.  One grandchild.  Had colonoscopy in 2007 and 10-year follow-up recommended.  Had repeat study in 2019 showing 2 polyps that were tubular adenomas were removed.  Follow-up recommended in 5 years. Had mammogram August 15.  Bone density study has been ordered.  Urine dipstick abnormal.  Culture pending.  Despite Crestor, she has an elevated LDL of 118 and was 115 last year.  Total cholesterol 200.  Triglycerides normal.  HDL 65.  TSH is normal.  CBC and C met are normal.   Review of Systems no new complaints     Objective:   Physical Exam Blood pressure 110/80 pulse 83 weight 189 pounds BMI 32.44 skin warm and dry.  Nodes none.  Neck is supple without JVD thyromegaly or carotid bruits.  Chest  clear to auscultation.  Breasts normal female without masses.  Cardiac exam regular rate and rhythm normal S1 and S2 without murmurs or gallops.  Abdomen soft nondistended without hepatosplenomegaly masses or tenderness.  Bimanual normal.  Last Pap was last year.  No lower extremity edema.  Neuro no focal deficits.  Affect, judgment and thought processes are normal.       Assessment & Plan:  Hyperlipidemia-continue Crestor-watch diet and try to get some exercise.  Follow-up in 1 year or as needed.  BMI 32-needs to work on diet exercise and weight loss  History of vitamin D deficiency but this was not checked with Medicare physical as it is not covered by Medicare.  Reminded to take vitamin D supplementation  Weight has increased only 1 pound since last year  To get pneumococcal 23 in the near future.  Flu vaccine given today.  History of urinary infections-urine culture pending.  Plan: Return in 1 year or as needed.  Subjective:   Patient presents for Medicare Annual/Subsequent preventive examination.  Review Past Medical/Family/Social: See above   Risk Factors  Current exercise habits: Not exercising as much as she could due to the pandemic Dietary issues discussed low-fat low carbohydrate-may want to try Dr. Migdalia [facility] clinic:   Cardiac risk factors: Hyperlipidemia  Depression Screen  (Note: if answer to either of the following is "Yes", a more complete depression screening is indicated)  Over the past two weeks, have you felt down, depressed or hopeless? No  Over the past two weeks, have you felt little interest or pleasure in doing things? No Have you lost interest or pleasure in daily life? No Do you often feel hopeless? No Do you cry easily over simple problems? No   Activities of Daily Living  In your present state of health, do you have any difficulty performing the following activities?:   Driving? No  Managing money? No  Feeding yourself? No  Getting from bed  to chair? No  Climbing a flight of stairs? No  Preparing food and eating?: No  Bathing or showering? No  Getting dressed: No  Getting to the toilet? No  Using the toilet:No  Moving around from place to place: No  In the past year have you fallen or had a near fall?:No  Are you sexually active? yes Do you have more than one partner? No   Hearing Difficulties: No  Do you often ask people to speak up or repeat themselves? No  Do you experience ringing or noises in your ears? No  Do you have difficulty understanding soft or whispered voices? No  Do you feel that you have a problem with memory? No Do you often misplace items? No    Home Safety:  Do you have a smoke alarm at your residence? Yes Do you have grab bars in the bathroom?  No Do you have throw rugs in your house?  No   Cognitive Testing  Alert? Yes Normal Appearance?Yes  Oriented to person? Yes Place? Yes  Time? Yes  Recall of three objects? Yes  Can perform simple calculations? Yes  Displays appropriate judgment?Yes  Can read the correct time from a watch face?Yes   List the Names of Other Physician/Practitioners you currently use:  See referral list for the physicians patient is currently seeing.     Review of Systems: See above   Objective:     General appearance: Appears younger than stated age and obese  Head: Normocephalic, without obvious abnormality, atraumatic  Eyes: conj clear, EOMi PEERLA  Ears: normal TM's and external ear canals both ears  Nose: Nares normal. Septum midline. Mucosa normal. No drainage or sinus tenderness.  Throat: lips, mucosa, and tongue normal; teeth and gums normal  Neck: no adenopathy, no carotid bruit, no JVD, supple, symmetrical, trachea midline and thyroid not enlarged, symmetric, no tenderness/mass/nodules  No CVA tenderness.  Lungs: clear to auscultation bilaterally  Breasts: normal appearance, no masses or tenderness Heart: regular rate and rhythm, S1, S2 normal, no  murmur, click, rub or gallop  Abdomen: soft, non-tender; bowel sounds normal; no masses, no organomegaly  Musculoskeletal: ROM normal in all joints, no crepitus, no deformity, Normal muscle strengthen. Back  is symmetric, no curvature. Skin: Skin color, texture, turgor normal. No rashes or lesions  Lymph nodes: Cervical, supraclavicular, and axillary nodes normal.  Neurologic: CN 2 -12 Normal, Normal symmetric reflexes. Normal coordination and gait  Psych: Alert & Oriented x 3, Mood appear stable.    Assessment:    Annual wellness medicare exam   Plan:    During the course of the visit the patient was educated and counseled about appropriate screening and preventive services including:   Annual flu vaccine  Pneumococcal 23     Patient Instructions (the written plan) was given to the patient.  Medicare Attestation  I have personally reviewed:  The patient's medical and social history  Their use of alcohol,  tobacco or illicit drugs  Their current medications and supplements  The patient's functional ability including ADLs,fall risks, home safety risks, cognitive, and hearing and visual impairment  Diet and physical activities  Evidence for depression or mood disorders  The patient's weight, height, BMI, and visual acuity have been recorded in the chart. I have made referrals, counseling, and provided education to the patient based on review of the above and I have provided the patient with a written personalized care plan for preventive services.

## 2019-05-04 LAB — URINE CULTURE
MICRO NUMBER:: 823521
Result:: NO GROWTH
SPECIMEN QUALITY:: ADEQUATE

## 2019-05-06 ENCOUNTER — Encounter: Payer: Self-pay | Admitting: Internal Medicine

## 2019-05-06 NOTE — Patient Instructions (Addendum)
Flu vaccine given today.  Return in September for pneumococcal 23.  Continue lipid-lowering medication.  Continue vitamin D supplement orally.  Follow-up in 1 year or as needed.  Reminded regarding annual mammogram.  Urine has been cultured due to abnormal dipstick urinalysis.  Results will be called to you.

## 2019-05-07 DIAGNOSIS — Z1231 Encounter for screening mammogram for malignant neoplasm of breast: Secondary | ICD-10-CM | POA: Diagnosis not present

## 2019-05-07 DIAGNOSIS — Z803 Family history of malignant neoplasm of breast: Secondary | ICD-10-CM | POA: Diagnosis not present

## 2019-05-07 LAB — HM MAMMOGRAPHY

## 2019-05-09 ENCOUNTER — Encounter: Payer: Self-pay | Admitting: Internal Medicine

## 2019-05-24 ENCOUNTER — Other Ambulatory Visit: Payer: Self-pay

## 2019-05-24 ENCOUNTER — Encounter: Payer: Self-pay | Admitting: Internal Medicine

## 2019-05-24 ENCOUNTER — Ambulatory Visit (INDEPENDENT_AMBULATORY_CARE_PROVIDER_SITE_OTHER): Payer: Medicare Other | Admitting: Internal Medicine

## 2019-05-24 VITALS — BP 110/80 | HR 68 | Temp 98.3°F | Ht 64.0 in | Wt 189.0 lb

## 2019-05-24 DIAGNOSIS — Z23 Encounter for immunization: Secondary | ICD-10-CM

## 2019-05-24 NOTE — Patient Instructions (Addendum)
Patient received a pneumonia injection vaccine IM R deltoid, AV, CMA

## 2019-05-24 NOTE — Progress Notes (Signed)
Pneumococcal 23 vaccine given by CMA 

## 2019-08-08 ENCOUNTER — Encounter: Payer: Self-pay | Admitting: Internal Medicine

## 2019-08-08 ENCOUNTER — Telehealth: Payer: Self-pay

## 2019-08-08 ENCOUNTER — Ambulatory Visit (INDEPENDENT_AMBULATORY_CARE_PROVIDER_SITE_OTHER): Payer: Medicare Other | Admitting: Internal Medicine

## 2019-08-08 ENCOUNTER — Other Ambulatory Visit: Payer: Self-pay

## 2019-08-08 VITALS — Temp 97.8°F | Ht 64.0 in | Wt 189.0 lb

## 2019-08-08 DIAGNOSIS — H1033 Unspecified acute conjunctivitis, bilateral: Secondary | ICD-10-CM | POA: Diagnosis not present

## 2019-08-08 MED ORDER — OFLOXACIN 0.3 % OP SOLN: OPHTHALMIC | 1 refills | Status: DC

## 2019-08-08 NOTE — Telephone Encounter (Signed)
Scheduled appointment

## 2019-08-08 NOTE — Telephone Encounter (Signed)
Patient called states she has pink eye the right left eye is worse, red, crusty and swelling. She wants to know if you see her or do a virtual visit?

## 2019-08-08 NOTE — Progress Notes (Signed)
   Subjective:    Patient ID: Carla Collins, female    DOB: Oct 19, 1950, 68 y.o.   MRN: [id number]  HPI 68 year old Female seen today via interactive audio and video telecommunications due to the Coronavirus pandemic.  She is identified using 2 identifiers as Carla Collins. Carla Collins, a patient in this practice.  She is agreeable to visit in this format today.  In December 2019, patient was seen with respiratory infection symptoms and noted to have bilateral conjunctivitis which was treated with Ocuflox ophthalmic drops and Biaxin.  Over the ensuing year she had acute respiratory infection in January and another acute lower respiratory infection in March.  She had  Medicare wellness visit in August and health maintenance exam.  History of hyperlipidemia treated with Crestor.  History of vitamin D deficiency and migraine headaches.   Review of Systems denies fever or shaking chills.  Complaining of bilateral eye redness with drainage.     Objective:   Physical Exam Reports that she is afebrile.  Conjunctivae examined virtually and it appears that both right and left eyes are  injected.  Cannot visualize drainage from the eyes but she says that is present.  Denies URI symptoms cough or sore throat.      Assessment & Plan:  Bilateral conjunctivitis  Plan: Recommend daily washing of washcloth and towels.  Also pillowcases.  Ocuflox ophthalmic drops 2 drops 4 times a day for 5 to 7 days.  Wash hands frequently.  Avoid contact with grandchild until symptoms resolved

## 2019-08-08 NOTE — Telephone Encounter (Signed)
Virtual visit 

## 2019-08-31 NOTE — Patient Instructions (Signed)
Ocuflox ophthalmic drops 2 drops in each eye 4 times a day for 5 to 7 days.  Wash linens daily until symptoms resolve.  Wash hands frequently and avoid contact with grandchild until resolved

## 2019-11-04 ENCOUNTER — Other Ambulatory Visit: Payer: Self-pay

## 2019-11-04 ENCOUNTER — Other Ambulatory Visit: Payer: Medicare Other | Admitting: Internal Medicine

## 2019-11-04 DIAGNOSIS — E785 Hyperlipidemia, unspecified: Secondary | ICD-10-CM | POA: Diagnosis not present

## 2019-11-04 LAB — HEPATIC FUNCTION PANEL
AG Ratio: 1.2 (calc) (ref 1.0–2.5)
ALT: 16 U/L (ref 6–29)
AST: 21 U/L (ref 10–35)
Albumin: 4 g/dL (ref 3.6–5.1)
Alkaline phosphatase (APISO): 91 U/L (ref 37–153)
Bilirubin, Direct: 0.1 mg/dL (ref 0.0–0.2)
Globulin: 3.4 g/dL (calc) (ref 1.9–3.7)
Indirect Bilirubin: 0.4 mg/dL (calc) (ref 0.2–1.2)
Total Bilirubin: 0.5 mg/dL (ref 0.2–1.2)
Total Protein: 7.4 g/dL (ref 6.1–8.1)

## 2019-11-04 LAB — LIPID PANEL
Cholesterol: 172 mg/dL (ref <200)
HDL: 63 mg/dL (ref >=50)
LDL Cholesterol (Calc): 94 mg/dL (calc)
Non-HDL Cholesterol (Calc): 109 mg/dL (calc) (ref <130)
Total CHOL/HDL Ratio: 2.7 (calc) (ref <5.0)
Triglycerides: 60 mg/dL (ref <150)

## 2019-11-07 ENCOUNTER — Ambulatory Visit (INDEPENDENT_AMBULATORY_CARE_PROVIDER_SITE_OTHER): Payer: Medicare Other | Admitting: Internal Medicine

## 2019-11-07 ENCOUNTER — Encounter: Payer: Self-pay | Admitting: Internal Medicine

## 2019-11-07 ENCOUNTER — Other Ambulatory Visit: Payer: Self-pay

## 2019-11-07 VITALS — BP 110/70 | HR 73 | Temp 97.9°F | Ht 64.0 in | Wt 195.0 lb

## 2019-11-07 DIAGNOSIS — E7800 Pure hypercholesterolemia, unspecified: Secondary | ICD-10-CM

## 2019-11-07 NOTE — Progress Notes (Signed)
   Subjective:    Patient ID: Carla Collins, female    DOB: 08-12-1951, 69 y.o.   MRN: [id number]  HPI 69 year old Female with history pure hypercholesterolemia dating back several years.  In 2014 total cholesterol was 257, LDL was 190.  Triglycerides normal at 79 and HDL was normal at 51.  She started Crestor in 20 1710 mg daily at supper and does well.  Recent fasting lipid panel entirely within normal limits.  Liver functions are normal as well.  Has had 2 COVID-19 vaccines.  Did well after vaccinations.  Says daughter is getting married in November in [place] and patient is planning a trip to [place] at that time.    Review of Systems no new complaints.     Objective:   Physical Exam Blood pressure excellent 110/70 BMI 33.47.  Would like BMI to be closer to 30.  Weight 195 pounds.  Pulse 73 regular.  Pulse oximetry 97%. Skin warm and dry.  Nodes none.  No thyromegaly.  Chest clear to auscultation.  Cardiac exam regular rate and rhythm normal S1-S2 without murmurs or gallops.  Extremities without edema.      Assessment & Plan:  Pure hypercholesterolemia-normal lipid panel on Crestor 10 mg daily.  Plan: Continue current dose of Crestor and follow-up in 6 months for health maintenance exam and Medicare wellness visit.

## 2019-11-07 NOTE — Patient Instructions (Addendum)
It was a pleasure to see you today. Continue current meds and RTc in 6 months. Has had 2 Covid-19 injections.

## 2019-12-24 DIAGNOSIS — D235 Other benign neoplasm of skin of trunk: Secondary | ICD-10-CM | POA: Diagnosis not present

## 2019-12-24 DIAGNOSIS — L821 Other seborrheic keratosis: Secondary | ICD-10-CM | POA: Diagnosis not present

## 2020-02-07 ENCOUNTER — Telehealth: Payer: Self-pay

## 2020-02-07 NOTE — Telephone Encounter (Addendum)
Patient called has had a HA and vision changes off and on for over 4 months. She said she checked her BP a few minutes ago and was elevated 150/110, 139/101. She would like to come in next week to be seen ok to schedule?

## 2020-02-07 NOTE — Telephone Encounter (Signed)
Scheduled

## 2020-02-07 NOTE — Telephone Encounter (Signed)
See next week

## 2020-02-10 ENCOUNTER — Ambulatory Visit (INDEPENDENT_AMBULATORY_CARE_PROVIDER_SITE_OTHER): Payer: Medicare Other | Admitting: Internal Medicine

## 2020-02-10 ENCOUNTER — Encounter: Payer: Self-pay | Admitting: Internal Medicine

## 2020-02-10 ENCOUNTER — Other Ambulatory Visit: Payer: Self-pay

## 2020-02-10 VITALS — BP 110/80 | HR 74 | Ht 64.0 in | Wt 188.0 lb

## 2020-02-10 DIAGNOSIS — G43909 Migraine, unspecified, not intractable, without status migrainosus: Secondary | ICD-10-CM

## 2020-02-10 DIAGNOSIS — G43111 Migraine with aura, intractable, with status migrainosus: Secondary | ICD-10-CM

## 2020-02-10 DIAGNOSIS — H5310 Unspecified subjective visual disturbances: Secondary | ICD-10-CM | POA: Diagnosis not present

## 2020-02-10 DIAGNOSIS — H524 Presbyopia: Secondary | ICD-10-CM | POA: Diagnosis not present

## 2020-02-10 LAB — CBC WITH DIFFERENTIAL/PLATELET
Absolute Monocytes: 486 cells/uL (ref 200–950)
Basophils Absolute: 48 cells/uL (ref 0–200)
Basophils Relative: 0.8 %
Eosinophils Absolute: 48 cells/uL (ref 15–500)
Eosinophils Relative: 0.8 %
HCT: 45.4 % — ABNORMAL HIGH (ref 35.0–45.0)
Hemoglobin: 15.2 g/dL (ref 11.7–15.5)
Lymphs Abs: 3246 cells/uL (ref 850–3900)
MCH: 30.6 pg (ref 27.0–33.0)
MCHC: 33.5 g/dL (ref 32.0–36.0)
MCV: 91.5 fL (ref 80.0–100.0)
MPV: 12.1 fL (ref 7.5–12.5)
Monocytes Relative: 8.1 %
Neutro Abs: 2172 cells/uL (ref 1500–7800)
Neutrophils Relative %: 36.2 %
Platelets: 263 10*3/uL (ref 140–400)
RBC: 4.96 10*6/uL (ref 3.80–5.10)
RDW: 12.9 % (ref 11.0–15.0)
Total Lymphocyte: 54.1 %
WBC: 6 10*3/uL (ref 3.8–10.8)

## 2020-02-10 LAB — SEDIMENTATION RATE: Sed Rate: 28 mm/h (ref 0–30)

## 2020-02-10 MED ORDER — RIZATRIPTAN BENZOATE 10 MG PO TABS: 10.0000 mg | ORAL_TABLET | ORAL | 1 refills | Status: AC | PRN

## 2020-02-10 NOTE — Patient Instructions (Signed)
Trial of Maxalt at onset of migraine. Please have thorough eye exam. Referring you to neurologist since there is visual disturbance associated with headache.

## 2020-02-10 NOTE — Progress Notes (Signed)
   Subjective:    Patient ID: Carla Collins, female    DOB: 1951-02-10, 69 y.o.   MRN: [id number]  HPI 69 year old Female seen today for frontal headaches with very little nausea and no vomiting onset around November or December. Sometimes has visual disturbance like looking through kaleidoscope.Photophobia with these headaches. Has tried OTC meds without relief. Patient is anxious about etiology. Discussed at length. She is menopausal. Denies significant family stress. Is retired. Advised headaches can be brought on by stress, foods/drinks containing nitrites or sulfites, lack of sleep, and alcohol.  Records indicate she has past history of migraine headaches- so these are not new. Has no prescription med on file for these.  General health is excellent. She has a history of hyperlipidemia treated with Crestor.  She has a history of vitamin D deficiency.  Vitamin D level not checked recently due to expense.  Level was 27 in 2017.  Lipid panel was normal in March of this year.  She has had 2 COVID-19 vaccines.  When she called to make the appointment several days ago she said she had had visual changes with headache off and on for over 4 months.  With that headache her blood pressure is elevated between 902 and 111 systolically and 10 1-1 10 diastolically.  Review of Systems see above     Objective:   Physical Exam Blood pressure today in office is 110/80, pulse 74, pulse oximetry 97% weight 188 pounds, BMI 32.27  Skin warm and dry.  Nodes none.  PERRLA.  Extraocular movements are full.  Funduscopic exam not done.  Cranial nerves II through XII are grossly intact.  Thought affect and mentation are normal.  Muscle strength is 5/5 in all groups tested.  Deep tendon reflexes 2+ and symmetrical in the arms and knees.  Cerebellar finger-to-nose testing is normal and gait is normal.     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  Since she has anxiety related to this problem, we will refer her to neurologist for confirmation and evaluation.  30 minutes spent with patient including review of records, history taking and physical examination and medical decision making.  Reviewed  migraine triggers.

## 2020-02-11 ENCOUNTER — Encounter: Payer: Self-pay | Admitting: Neurology

## 2020-04-17 ENCOUNTER — Ambulatory Visit (INDEPENDENT_AMBULATORY_CARE_PROVIDER_SITE_OTHER): Payer: Medicare Other | Admitting: Internal Medicine

## 2020-04-17 ENCOUNTER — Telehealth: Payer: Self-pay | Admitting: Internal Medicine

## 2020-04-17 ENCOUNTER — Encounter: Payer: Self-pay | Admitting: Internal Medicine

## 2020-04-17 ENCOUNTER — Other Ambulatory Visit: Payer: Self-pay

## 2020-04-17 VITALS — Temp 98.6°F

## 2020-04-17 DIAGNOSIS — J069 Acute upper respiratory infection, unspecified: Secondary | ICD-10-CM | POA: Diagnosis not present

## 2020-04-17 DIAGNOSIS — Z20822 Contact with and (suspected) exposure to covid-19: Secondary | ICD-10-CM

## 2020-04-17 MED ORDER — AZITHROMYCIN 250 MG PO TABS: ORAL_TABLET | ORAL | 0 refills | Status: DC

## 2020-04-17 NOTE — Telephone Encounter (Signed)
Scheduled

## 2020-04-17 NOTE — Patient Instructions (Signed)
COVID-19 test performed by nasal swab and result is pending.  Take Zithromax Z-PAK 2 p.o. day 1 followed by 1 p.o. days 2 through 5.  Quarantine until test results are back.  Rest and drink plenty of fluids.

## 2020-04-17 NOTE — Progress Notes (Signed)
   Subjective:    Patient ID: Carla Collins, female    DOB: Apr 15, 1951, 69 y.o.   MRN: [id number]  HPI 69 year old Female seen today outside her car here at my office regarding respiratory congestion.  Patient complaining of ear pain and sore throat.  Her granddaughter has been ill with a respiratory infection.  Due to the COVID-19 pandemic resurgence, patient is seen outside my office adjacent to her personal vehicle.  I am in protective gear with my [place], [place].  Patient has had 2 COVID-19 immunizations in January and February of this year.  She has malaise and fatigue.  Granddaughter 39 years old was just getting over similar symptoms and was not tested for Covid apparently.    Review of Systems nasal congestion, runny nose, sore throat, earache and postnasal drip.  No documented fever.     Objective:   Physical Exam Temperature is 98.6 degrees.  Her TMs are slightly full but not red.  Her pharynx is slightly injected with no exudate.  Her chest is clear to auscultation without rales or wheezing.  She is in no acute distress and is not tachypneic.       Assessment & Plan:  Acute upper respiratory infection  Rule out COVID-19 delta variant infection  Plan: COVID-19 testing performed today with nasal swab and sent to [facility] lab.  We have called in Zithromax Z-PAK 2 p.o. day 1 followed by 1 p.o. days 2 through 5.  Patient is to quarantine until test results are back which is likely to be about 48 hours.  She is to rest at home and drink plenty of fluids.  Time spent with visit today including history taking, taking of vital signs and physical examination as well as reviewing previous records is 20 minutes in addition to deprescribing Z-Pak and following up with test results for COVID-19.

## 2020-04-17 NOTE — Telephone Encounter (Signed)
Carla Collins [phone]  [name] called to say she has sore throat, ear ache, congestion, runny nose,drainage. No COVID exposure that she knows of. Had both COVID vaccines. However granddaughter 68 years old just getting over similar symptoms was not tested for COVID

## 2020-04-17 NOTE — Telephone Encounter (Signed)
CAR VISIT

## 2020-04-18 LAB — SARS-COV-2 RNA,(COVID-19) QUALITATIVE NAAT: SARS CoV2 RNA: NOT DETECTED

## 2020-04-19 ENCOUNTER — Telehealth: Payer: Self-pay | Admitting: Internal Medicine

## 2020-04-19 DIAGNOSIS — R05 Cough: Secondary | ICD-10-CM

## 2020-04-19 DIAGNOSIS — R059 Cough, unspecified: Secondary | ICD-10-CM

## 2020-04-19 MED ORDER — HYDROCODONE-HOMATROPINE 5-1.5 MG/5ML PO SYRP: 5.0000 mL | ORAL_SOLUTION | Freq: Three times a day (TID) | ORAL | 0 refills | Status: DC | PRN

## 2020-04-19 NOTE — Telephone Encounter (Signed)
Covid test is negative. Patient called with results. Has cough keeping her awake at night. Sore throat improved. Call in Hycodan to pharmacy for cough.

## 2020-04-28 ENCOUNTER — Other Ambulatory Visit: Payer: Self-pay | Admitting: Internal Medicine

## 2020-05-04 NOTE — Progress Notes (Signed)
NEUROLOGY CONSULTATION NOTE  Carla Collins MRN: [id number] DOB: [birth year]  Referring provider: Cresenciano Lick. Renold Genta, MD Primary care provider: Cresenciano Lick. Renold Genta, MD  Reason for consult:  migraines  HISTORY OF PRESENT ILLNESS: Carla Collins. Carla Collins is a 69 year old right-handed female who presents for migraines.  History supplemented by referring provider's note.  Carla Collins has history of migraines that resolved after hysterectomy.  About 2 years ago, she began experiencing a different headache.  It is a moderate non-throbbing occipital headache and resolves within 2 hours of getting up and taking 400mg  ibuprofen.  They may last several hours without ibuprofen.  No associated nausea, vomiting, photophobia, phonophobia, visual disturbance, dizziness, numbness or weakness. It occurs about 3 days out of the week.  She denies neck pain.  She says she is a poor sleeper, typically wakes up in middle of night, but does not feel sleepy during the day.  Her husband says that she does snore.    Over the past year and a half, she has been experiencing migraine with aura.  She endorses kaleidoscope vision lasting several minutes followed by a left sided headache.  Frequency varies but typically occurs 3 to 4 times a month.  It usually occurs when she is tired.  No visual aura with her previous migraines.  Eye exam reportedly unremarkable.  Sed rate from June was 28.    Current NSAIDS:  Ibuprofen 400mg  Current analgesics:  none Current triptans:  Rizatriptan 10mg  (prescribed but never tried) Current ergotamine:  none Current anti-emetic:  none Current muscle relaxants:  none Current anti-anxiolytic:  none Current sleep aide:  none Current Antihypertensive medications:  none Current Antidepressant medications:  none Current Anticonvulsant medications:  none Current anti-CGRP:  none Current Vitamins/Herbal/Supplements:  none Current Antihistamines/Decongestants:  none Other therapy:  none Hormone/birth  control:  none  Past NSAIDS:  none Past analgesics:  none Past abortive triptans:  none Past abortive ergotamine:  none Past muscle relaxants:  none Past anti-emetic:  none Past antihypertensive medications:  none Past antidepressant medications:  none Past anticonvulsant medications:  none Past anti-CGRP:  none Past vitamins/Herbal/Supplements:  none Past antihistamines/decongestants:  none Other past therapies:  none  Caffeine:  Iced tea 3 to 4 days a week.  Rarely coffee Diet:  32 oz water meals.  Does not skip meals.  Salad, egg.  Exercise:  Walks 3 times a week Depression:  no; Anxiety:  no Other pain:  none Sleep hygiene:  Wakes up at 1:30 AM and falls back asleep at 3 AM until she wakes up at 6:30 AM.  She averages  5 hours a night.  She does snore.  Denies any significant daytime sleepiness. Family history of headache:  mother   PAST MEDICAL HISTORY: Past Medical History:  Diagnosis Date  . Cervical dysplasia   . History of migraines   . Hyperlipidemia   . Urticaria   . Vitamin D deficiency     PAST SURGICAL HISTORY: Past Surgical History:  Procedure Laterality Date  . ABDOMINAL HYSTERECTOMY    . CHOLECYSTECTOMY      MEDICATIONS: Current Outpatient Medications on File Prior to Visit  Medication Sig Dispense Refill  . azithromycin (ZITHROMAX Z-PAK) 250 MG tablet Take 2 tablets (500 mg) on  Day 1,  followed by 1 tablet (250 mg) once daily on Days 2 through 5. 6 tablet 0  . HYDROcodone-homatropine (HYCODAN) 5-1.5 MG/5ML syrup Take 5 mLs by mouth every 8 (eight) hours as needed for cough. [place]  mL 0  . rizatriptan (MAXALT) 10 MG tablet Take 1 tablet (10 mg total) by mouth as needed for migraine. May repeat in 2 hours if needed 10 tablet 1  . rosuvastatin (CRESTOR) 10 MG tablet TAKE 1 TABLET EVERY DAY AT SUPPER 90 tablet 3  . triamcinolone cream (KENALOG) 0.1 % APPLY TO RASH 2 OR 3 TIMES A DAY UNTIL HEALED 30 g 1   No current facility-administered medications on  file prior to visit.    ALLERGIES: Allergies  Allergen Reactions  . Sulfa Antibiotics Rash    FAMILY HISTORY: Family History  Problem Relation Age of Onset  . Cancer Father   . Colon cancer Neg Hx   . Esophageal cancer Neg Hx   . Rectal cancer Neg Hx   . Stomach cancer Neg Hx     SOCIAL HISTORY: Social History   Socioeconomic History  . Marital status: Single    Spouse name: Not on file  . Number of children: Not on file  . Years of education: Not on file  . Highest education level: Not on file  Occupational History  . Not on file  Tobacco Use  . Smoking status: Never Smoker  . Smokeless tobacco: Never Used  Substance and Sexual Activity  . Alcohol use: No  . Drug use: No  . Sexual activity: Not on file  Other Topics Concern  . Not on file  Social History Narrative  . Not on file   Social Determinants of Health   Financial Resource Strain:   . Difficulty of Paying Living Expenses: Not on file  Food Insecurity:   . Worried About Charity fundraiser in the Last Year: Not on file  . Ran Out of Food in the Last Year: Not on file  Transportation Needs:   . Lack of Transportation (Medical): Not on file  . Lack of Transportation (Non-Medical): Not on file  Physical Activity:   . Days of Exercise per Week: Not on file  . Minutes of Exercise per Session: Not on file  Stress:   . Feeling of Stress : Not on file  Social Connections:   . Frequency of Communication with Friends and Family: Not on file  . Frequency of Social Gatherings with Friends and Family: Not on file  . Attends Religious Services: Not on file  . Active Member of Clubs or Organizations: Not on file  . Attends Archivist Meetings: Not on file  . Marital Status: Not on file  Intimate Partner Violence:   . Fear of Current or Ex-Partner: Not on file  . Emotionally Abused: Not on file  . Physically Abused: Not on file  . Sexually Abused: Not on file    PHYSICAL EXAM: Blood pressure  130/79, pulse 89, height 5\' 6"  (1.676 m), weight 189 lb 9.6 oz (86 kg), SpO2 98 %. General: No acute distress.  Patient appears well-groomed.  Head:  Normocephalic/atraumatic Eyes:  fundi examined but not visualized Neck: supple, no paraspinal tenderness, full range of motion Back: No paraspinal tenderness Heart: regular rate and rhythm Lungs: Clear to auscultation bilaterally. Vascular: No carotid bruits. Neurological Exam: Mental status: alert and oriented to person, place, and time, recent and remote memory intact, fund of knowledge intact, attention and concentration intact, speech fluent and not dysarthric, language intact. Cranial nerves: CN I: not tested CN II: pupils equal, round and reactive to light, visual fields intact CN III, IV, VI:  full range of motion, no nystagmus, no ptosis CN  V: facial sensation intact CN VII: upper and lower face symmetric CN VIII: hearing intact CN IX, X: gag intact, uvula midline CN XI: sternocleidomastoid and trapezius muscles intact CN XII: tongue midline Bulk & Tone: normal, no fasciculations. Motor:  5/5 throughout  Sensation:  Pinprick and vibration sensation intact. Deep Tendon Reflexes:  2+ throughout, toes downgoing.  Finger to nose testing:  Without dysmetria.  Heel to shin:  Without dysmetria.  Gait:  Normal station and stride.  Romberg negative.  IMPRESSION: 1.  Morning headaches.  First consider secondary etiologies such as mass lesion or sleep apnea.  As they tend to be occipital, consider cervicogenic etiology but she denies neck pain.  While these headaches do not meet criteria for migraine, may still be migraine given her history. 2.  Migraine with aura, without status migrainosus, not intractable.  PLAN: I would like to rule out a secondary intracranial etiology such as mass lesion or cerebral aneurysm.  Will check MRI and MRA of brain.  If unremarkable, consider sleep study.  At this time, she defers preventative  pharmacologic management.  She may use ibuprofen as it is effective but no more than 3 days out of the week to prevent rebound headache.  Follow up.  Thank you for allowing me to take part in the care of this patient.  Metta Clines, DO  CC: Cresenciano Lick. Renold Genta, MD

## 2020-05-05 ENCOUNTER — Other Ambulatory Visit: Payer: Self-pay

## 2020-05-05 ENCOUNTER — Ambulatory Visit: Payer: Medicare Other | Admitting: Neurology

## 2020-05-05 ENCOUNTER — Encounter: Payer: Self-pay | Admitting: Neurology

## 2020-05-05 VITALS — BP 130/79 | HR 89 | Ht 66.0 in | Wt 189.6 lb

## 2020-05-05 DIAGNOSIS — G43009 Migraine without aura, not intractable, without status migrainosus: Secondary | ICD-10-CM | POA: Diagnosis not present

## 2020-05-05 DIAGNOSIS — R519 Headache, unspecified: Secondary | ICD-10-CM | POA: Diagnosis not present

## 2020-05-05 NOTE — Patient Instructions (Signed)
1.  Will check MRI and MRA of head.  Further recommendations pending results. 2.  In meantime, may use ibuprofen.  Just try to limit use to prevent rebound headache (usually no more than 3 days out of the week, preferably no more than 10 days in a month). 3.  Follow up in 4 to 6 months.

## 2020-05-14 ENCOUNTER — Other Ambulatory Visit: Payer: Medicare Other | Admitting: Internal Medicine

## 2020-05-14 ENCOUNTER — Other Ambulatory Visit: Payer: Self-pay

## 2020-05-14 DIAGNOSIS — Z1329 Encounter for screening for other suspected endocrine disorder: Secondary | ICD-10-CM

## 2020-05-14 DIAGNOSIS — E7800 Pure hypercholesterolemia, unspecified: Secondary | ICD-10-CM

## 2020-05-14 DIAGNOSIS — Z0000 Encounter for general adult medical examination without abnormal findings: Secondary | ICD-10-CM

## 2020-05-14 LAB — CBC WITH DIFFERENTIAL/PLATELET
Absolute Monocytes: 466 cells/uL (ref 200–950)
Basophils Absolute: 42 cells/uL (ref 0–200)
Basophils Relative: 0.8 %
Eosinophils Absolute: 101 cells/uL (ref 15–500)
Eosinophils Relative: 1.9 %
HCT: 45.2 % — ABNORMAL HIGH (ref 35.0–45.0)
Hemoglobin: 15.1 g/dL (ref 11.7–15.5)
Lymphs Abs: 3169 cells/uL (ref 850–3900)
MCH: 30.7 pg (ref 27.0–33.0)
MCHC: 33.4 g/dL (ref 32.0–36.0)
MCV: 91.9 fL (ref 80.0–100.0)
MPV: 11.8 fL (ref 7.5–12.5)
Monocytes Relative: 8.8 %
Neutro Abs: 1521 cells/uL (ref 1500–7800)
Neutrophils Relative %: 28.7 %
Platelets: 241 10*3/uL (ref 140–400)
RBC: 4.92 10*6/uL (ref 3.80–5.10)
RDW: 12.9 % (ref 11.0–15.0)
Total Lymphocyte: 59.8 %
WBC: 5.3 10*3/uL (ref 3.8–10.8)

## 2020-05-14 LAB — COMPLETE METABOLIC PANEL WITH GFR
AG Ratio: 1.2 (calc) (ref 1.0–2.5)
ALT: 15 U/L (ref 6–29)
AST: 23 U/L (ref 10–35)
Albumin: 3.9 g/dL (ref 3.6–5.1)
Alkaline phosphatase (APISO): 92 U/L (ref 37–153)
BUN: 14 mg/dL (ref 7–25)
CO2: 27 mmol/L (ref 20–32)
Calcium: 9.3 mg/dL (ref 8.6–10.4)
Chloride: 106 mmol/L (ref 98–110)
Creat: 0.93 mg/dL (ref 0.50–0.99)
GFR, Est African American: 74 mL/min/{1.73_m2} (ref >=60)
GFR, Est Non African American: 64 mL/min/{1.73_m2} (ref >=60)
Globulin: 3.3 g/dL (calc) (ref 1.9–3.7)
Glucose, Bld: 86 mg/dL (ref 65–99)
Potassium: 4.9 mmol/L (ref 3.5–5.3)
Sodium: 141 mmol/L (ref 135–146)
Total Bilirubin: 0.5 mg/dL (ref 0.2–1.2)
Total Protein: 7.2 g/dL (ref 6.1–8.1)

## 2020-05-14 LAB — LIPID PANEL
Cholesterol: 198 mg/dL (ref <200)
HDL: 58 mg/dL (ref >=50)
LDL Cholesterol (Calc): 121 mg/dL (calc) — ABNORMAL HIGH
Non-HDL Cholesterol (Calc): 140 mg/dL (calc) — ABNORMAL HIGH (ref <130)
Total CHOL/HDL Ratio: 3.4 (calc) (ref <5.0)
Triglycerides: 89 mg/dL (ref <150)

## 2020-05-14 LAB — TSH: TSH: 2.63 mIU/L (ref 0.40–4.50)

## 2020-05-15 ENCOUNTER — Encounter: Payer: Self-pay | Admitting: Internal Medicine

## 2020-05-15 ENCOUNTER — Ambulatory Visit (INDEPENDENT_AMBULATORY_CARE_PROVIDER_SITE_OTHER): Payer: Medicare Other | Admitting: Internal Medicine

## 2020-05-15 VITALS — BP 102/80 | HR 88 | Ht 66.0 in | Wt 187.0 lb

## 2020-05-15 DIAGNOSIS — R8290 Unspecified abnormal findings in urine: Secondary | ICD-10-CM

## 2020-05-15 DIAGNOSIS — Z8669 Personal history of other diseases of the nervous system and sense organs: Secondary | ICD-10-CM

## 2020-05-15 DIAGNOSIS — Z0000 Encounter for general adult medical examination without abnormal findings: Secondary | ICD-10-CM

## 2020-05-15 DIAGNOSIS — E7800 Pure hypercholesterolemia, unspecified: Secondary | ICD-10-CM

## 2020-05-15 LAB — POCT URINALYSIS DIPSTICK
Bilirubin, UA: NEGATIVE
Glucose, UA: NEGATIVE
Ketones, UA: NEGATIVE
Nitrite, UA: NEGATIVE
Protein, UA: POSITIVE — AB
Spec Grav, UA: 1.015 (ref 1.010–1.025)
Urobilinogen, UA: 0.2 E.U./dL
pH, UA: 6 (ref 5.0–8.0)

## 2020-05-15 NOTE — Progress Notes (Signed)
Subjective:    Patient ID: Carla Collins, female    DOB: 1951/02/25, 69 y.o.   MRN: [id number]  HPI  69 year old Female seen for Medicare wellness, health maintenance exam and evaluation of medical issues.  Has initial Medicare wellness exam in 2020.  She has a history of hyperlipidemia treated with Crestor.  History of stress and urge urinary incontinence.  History of vitamin D deficiency and migraine headaches.  Migraines are treated with Maxalt  Had colonoscopy 2019 showing 2 polyps that were tubular adenomas that were removed.  Follow-up recommended in 5 years.  Family history: Mother died of dementia complications.  There is family history of breast cancer in first-degree relative.  Father with history of MI.  1 sister alive and well.  Mother had history of hypertension and history of breast cancer at age 26.  Social history: She is married.  She has a Scientist, water quality.  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  Does not smoke or consume alcohol.  1 daughter.  One grandchild.  In March her lipids were normal on statin medication.  Now LDL is slightly elevated at 121 and should be less than 100.  CBC with differential and c-Met are normal.  TSH is normal.  Urine dipstick is moderate LE but culture revealed mixed flora.  Third Covid vaccine given September 5th.  He has had Prevnar 13 and pneumococcal 23 vaccines.  Tetanus immunizations up-to-date.  Recommend flu vaccine.  Mammogram due in the near future.  Order sent to [facility].  Has had Prevnar 13 and pneumococcal 23 vaccines.   Review of Systems  Constitutional: Negative.   Respiratory: Negative.   Cardiovascular: Negative.   Gastrointestinal: Negative.   Neurological:       History of migraine headaches  Psychiatric/Behavioral: Negative.        Objective:   Physical Exam  Blood pressure 102/80 pulse 88 pulse oximetry 98% weight 187 pounds BMI 30.18 weight is 2 pounds lighter than last  health maintenance exam in 2020.  Skin warm and dry.  No cervical adenopathy.  No thyromegaly.  No carotid bruits.  Chest is clear to auscultation.  Cardiac exam regular rate and rhythm normal S1 and S2.  Breast without masses.  Abdomen soft nondistended without hepatosplenomegaly masses or tenderness.  Pap deferred due to age.  Was last done in 2019.  Neuro intact without focal deficits on brief neurological exam.  Affect thought and judgment are normal.      Assessment & Plan:  History of migraine headaches currently treated with Maxalt.  Has appointment with neurologist in the near future.  Hyperlipidemia treated with Crestor 10 mg daily.  LDL is slightly elevated.  Watch diet and get more exercise.  Health maintenance: Order sent to [facility] for mammogram and bone density study.  Has had third Covid immunization.  Plan: Follow-up with lipid panel in 6 months as well as liver functions and office visit.  Continue to work on diet exercise and weight loss.  Subjective:   Patient presents for Medicare Annual/Subsequent preventive examination.  Review Past Medical/Family/Social: See above   Risk Factors  Current exercise habits: Light exercise Dietary issues discussed: Low-fat low carbohydrate discussed  Cardiac risk factors: Hyperlipidemia  Depression Screen  (Note: if answer to either of the following is "Yes", a more complete depression screening is indicated)   Over the past two weeks, have you felt down, depressed or hopeless? No  Over the past  two weeks, have you felt little interest or pleasure in doing things? No Have you lost interest or pleasure in daily life? No Do you often feel hopeless? No Do you cry easily over simple problems? No   Activities of Daily Living  In your present state of health, do you have any difficulty performing the following activities?:   Driving? No  Managing money? No  Feeding yourself? No  Getting from bed to chair? No  Climbing a flight of  stairs? No  Preparing food and eating?: No  Bathing or showering? No  Getting dressed: No  Getting to the toilet? No  Using the toilet:No  Moving around from place to place: No  In the past year have you fallen or had a near fall?:No  Are you sexually active?  Yes Do you have more than one partner? No   Hearing Difficulties: No  Do you often ask people to speak up or repeat themselves? No  Do you experience ringing or noises in your ears? No  Do you have difficulty understanding soft or whispered voices? No  Do you feel that you have a problem with memory? No Do you often misplace items? No    Home Safety:  Do you have a smoke alarm at your residence? Yes Do you have grab bars in the bathroom?  No Do you have throw rugs in your house?  No   Cognitive Testing  Alert? Yes Normal Appearance?Yes  Oriented to person? Yes Place? Yes  Time? Yes  Recall of three objects? Yes  Can perform simple calculations? Yes  Displays appropriate judgment?Yes  Can read the correct time from a watch face?Yes   List the Names of Other Physician/Practitioners you currently use:  See referral list for the physicians patient is currently seeing.    See above-has appointment with Neurologist Review of Systems: See above no new complaints  Objective:     General appearance: Appears stated age and mildly obese  Head: Normocephalic, without obvious abnormality, atraumatic  Eyes: conj clear, EOMi PEERLA  Ears: normal TM's and external ear canals both ears  Nose: Nares normal. Septum midline. Mucosa normal. No drainage or sinus tenderness.  Throat: lips, mucosa, and tongue normal; teeth and gums normal  Neck: no adenopathy, no carotid bruit, no JVD, supple, symmetrical, trachea midline and thyroid not enlarged, symmetric, no tenderness/mass/nodules  No CVA tenderness.  Lungs: clear to auscultation bilaterally  Breasts: normal appearance, no masses or tenderness Heart: regular rate and rhythm,  S1, S2 normal, no murmur, click, rub or gallop  Abdomen: soft, non-tender; bowel sounds normal; no masses, no organomegaly  Musculoskeletal: ROM normal in all joints, no crepitus, no deformity, Normal muscle strengthen. Back  is symmetric, no curvature. Skin: Skin color, texture, turgor normal. No rashes or lesions  Lymph nodes: Cervical, supraclavicular, and axillary nodes normal.  Neurologic: CN 2 -12 Normal, Normal symmetric reflexes. Normal coordination and gait  Psych: Alert & Oriented x 3, Mood appear stable.    Assessment:    Annual wellness medicare exam   Plan:    During the course of the visit the patient was educated and counseled about appropriate screening and preventive services including:   Has had Covid booster.  Prevnar 13 and pneumococcal vaccines up-to-date.  Recommend flu vaccine     Patient Instructions (the written plan) was given to the patient.  Medicare Attestation  I have personally reviewed:  The patient's medical and social history  Their use of alcohol, tobacco or  illicit drugs  Their current medications and supplements  The patient's functional ability including ADLs,fall risks, home safety risks, cognitive, and hearing and visual impairment  Diet and physical activities  Evidence for depression or mood disorders  The patient's weight, height, BMI, and visual acuity have been recorded in the chart. I have made referrals, counseling, and provided education to the patient based on review of the above and I have provided the patient with a written personalized care plan for preventive services.

## 2020-05-16 LAB — URINE CULTURE
MICRO NUMBER:: 10935300
SPECIMEN QUALITY:: ADEQUATE

## 2020-05-22 ENCOUNTER — Encounter: Payer: Self-pay | Admitting: Internal Medicine

## 2020-05-22 DIAGNOSIS — Z1231 Encounter for screening mammogram for malignant neoplasm of breast: Secondary | ICD-10-CM | POA: Diagnosis not present

## 2020-05-24 ENCOUNTER — Ambulatory Visit
Admission: RE | Admit: 2020-05-24 | Discharge: 2020-05-24 | Disposition: A | Discharge status: Home / Self Care | Payer: Medicare Other | Source: Ambulatory Visit | Attending: Neurology | Admitting: Neurology

## 2020-05-24 ENCOUNTER — Other Ambulatory Visit: Payer: Self-pay

## 2020-05-24 DIAGNOSIS — J3489 Other specified disorders of nose and nasal sinuses: Secondary | ICD-10-CM | POA: Diagnosis not present

## 2020-05-24 DIAGNOSIS — H538 Other visual disturbances: Secondary | ICD-10-CM | POA: Diagnosis not present

## 2020-05-24 DIAGNOSIS — R519 Headache, unspecified: Secondary | ICD-10-CM

## 2020-05-26 ENCOUNTER — Telehealth: Payer: Self-pay

## 2020-05-26 ENCOUNTER — Encounter: Payer: Self-pay | Admitting: Internal Medicine

## 2020-05-26 NOTE — Telephone Encounter (Signed)
LMOVM, For pt, Advised Mychart message sent in regards to her MRI since I missed her.

## 2020-05-26 NOTE — Telephone Encounter (Signed)
-----   Message from Pieter Partridge, DO sent at 05/25/2020  7:26 AM EDT ----- MRI of brain and blood vessels look okay.  No evidence of concerning abnormalities such as tumor or aneurysm.  If agreeable, we can refer her for a sleep study to evaluate for sleep apnea as a cause of morning headaches.

## 2020-05-28 ENCOUNTER — Other Ambulatory Visit: Payer: Medicare Other

## 2020-06-03 NOTE — Patient Instructions (Addendum)
Recommend repeating lipid panel with office visit in 6 months.  Continue current medications.  Continue diet and exercise efforts.  Has appointment with Neurologist soon.  Order placed for bone density study and mammogram through [facility].  Have flu vaccine in the near future.

## 2020-07-03 ENCOUNTER — Ambulatory Visit (INDEPENDENT_AMBULATORY_CARE_PROVIDER_SITE_OTHER): Payer: Medicare Other | Admitting: Internal Medicine

## 2020-07-03 ENCOUNTER — Telehealth: Payer: Self-pay | Admitting: Internal Medicine

## 2020-07-03 ENCOUNTER — Other Ambulatory Visit: Payer: Self-pay

## 2020-07-03 ENCOUNTER — Encounter: Payer: Self-pay | Admitting: Internal Medicine

## 2020-07-03 VITALS — BP 110/80 | HR 86 | Temp 97.9°F | Ht 66.0 in | Wt 193.0 lb

## 2020-07-03 DIAGNOSIS — J069 Acute upper respiratory infection, unspecified: Secondary | ICD-10-CM

## 2020-07-03 DIAGNOSIS — H6501 Acute serous otitis media, right ear: Secondary | ICD-10-CM | POA: Diagnosis not present

## 2020-07-03 MED ORDER — AZITHROMYCIN 250 MG PO TABS: ORAL_TABLET | ORAL | 0 refills | Status: DC

## 2020-07-03 NOTE — Telephone Encounter (Signed)
Scheduled

## 2020-07-03 NOTE — Patient Instructions (Signed)
Take Zithromax Z-PAK 2 p.o. day 1 followed by 1 p.o. days 2 through 5.  Take decongestant 1 hour prior to departing on plane trip to [place].  Rest and drink plenty of fluids.

## 2020-07-03 NOTE — Telephone Encounter (Signed)
Schedule OV

## 2020-07-03 NOTE — Telephone Encounter (Signed)
Carla Collins [phone]  [name] called to say yesterday she started having some sinus drainage and today she has right ear pain with a little bit of nose drainage. No COVID exposure. She had her COVID Booster on 05/10/2020

## 2020-07-03 NOTE — Telephone Encounter (Signed)
LVM to CB to schedule appointment

## 2020-07-03 NOTE — Progress Notes (Signed)
   Subjective:    Patient ID: Carla Collins, female    DOB: 1950-09-08, 69 y.o.   MRN: [id number]  HPI 69 year old Female seen acutely for right ear pain.  She is leaving for [place] on Tuesday, November 2 for 1 week.  Daughter is getting remarried there.  She is concerned about her ear pain.  She has no fever, chills, dysgeusia, nausea vomiting or myalgias.  Has had 3 Covid immunizations.  For couple of days has had pain in her right ear.  Denies sore throat.  Has had some nasal congestion and postnasal drip.  No significant sputum production.  No cough.  No known Covid exposure.  Had booster vaccine for COVID-19 05/10/2020.  Has to be tested before departing to [place] for [place] she says.  Her general health is excellent.  She has a history of hyperlipidemia and migraine headaches.   Review of Systems see above     Objective:   T 97.9 degrees orally blood pressure 110/80 pulse 86 weight 193 pounds pulse oximetry 97%  Pharynx is clear.  Right TM is full but not red.  Left TM clear.  Neck is supple.  Chest is clear to auscultation without rales or wheezing.      Assessment & Plan:  Acute URI  Acute right serous otitis media  Covid vaccines up-to-date.  Is to have Covid vaccine test prior to departing to [place] she says.  Plan: Zithromax Z-PAK 2 p.o. day 1 followed by 1 p.o. days 2 through 5.  Rest and drink plenty of fluids.  Make take Sudafed prior to departure on airplane since she is having ear pain.

## 2020-07-06 DIAGNOSIS — R059 Cough, unspecified: Secondary | ICD-10-CM | POA: Diagnosis not present

## 2020-07-06 DIAGNOSIS — Z20828 Contact with and (suspected) exposure to other viral communicable diseases: Secondary | ICD-10-CM | POA: Diagnosis not present

## 2020-07-07 DIAGNOSIS — Z20828 Contact with and (suspected) exposure to other viral communicable diseases: Secondary | ICD-10-CM | POA: Diagnosis not present

## 2020-07-07 DIAGNOSIS — R059 Cough, unspecified: Secondary | ICD-10-CM | POA: Diagnosis not present

## 2020-09-07 NOTE — Progress Notes (Deleted)
NEUROLOGY FOLLOW UP OFFICE NOTE  [name] [id number]   Subjective:  Carla Collins. Carla Collins is a 70 year old right-handed female who follows up for headache.  UPDATE: MRI and MRA of brain on 05/24/2020 personally reviewed were unremarkable.  Recommended sleep study ***  Intensity:  *** Duration:  *** Frequency:  *** Frequency of abortive medication: *** Current NSAIDS:  Ibuprofen 400mg  Current analgesics:  none Current triptans:  Rizatriptan 10mg  (prescribed but never tried) Current ergotamine:  none Current anti-emetic:  none Current muscle relaxants:  none Current anti-anxiolytic:  none Current sleep aide:  none Current Antihypertensive medications:  none Current Antidepressant medications:  none Current Anticonvulsant medications:  none Current anti-CGRP:  none Current Vitamins/Herbal/Supplements:  none Current Antihistamines/Decongestants:  none Other therapy:  none Hormone/birth control:  none  Caffeine:  Iced tea 3 to 4 days a week.  Rarely coffee Diet:  32 oz water meals.  Does not skip meals.  Salad, egg.  Exercise:  Walks 3 times a week Depression:  no; Anxiety:  no Other pain:  none Sleep hygiene:  Wakes up at 1:30 AM and falls back asleep at 3 AM until she wakes up at 6:30 AM.  She averages  5 hours a night.  She does snore.  Denies any significant daytime sleepiness.   HISTORY: Carla Collins has history of migraines that resolved after hysterectomy.  In 2019, she began experiencing a different headache.  It is a moderate non-throbbing occipital headache and resolves within 2 hours of getting up and taking 400mg  ibuprofen.  They may last several hours without ibuprofen.  No associated nausea, vomiting, photophobia, phonophobia, visual disturbance, dizziness, numbness or weakness. It occurs about 3 days out of the week.  She denies neck pain.  She says she is a poor sleeper, typically wakes up in middle of night, but does not feel sleepy during the day.  Her  husband says that she does snore.    Since 2020, she has been experiencing migraine with aura.  She endorses kaleidoscope vision lasting several minutes followed by a left sided headache.  Frequency varies but typically occurs 3 to 4 times a month.  It usually occurs when she is tired.  No visual aura with her previous migraines.  Eye exam reportedly unremarkable.  Sed rate from June 2021 was 28.     Past NSAIDS:  none Past analgesics:  none Past abortive triptans:  none Past abortive ergotamine:  none Past muscle relaxants:  none Past anti-emetic:  none Past antihypertensive medications:  none Past antidepressant medications:  none Past anticonvulsant medications:  none Past anti-CGRP:  none Past vitamins/Herbal/Supplements:  none Past antihistamines/decongestants:  none Other past therapies:  none   Family history of headache:  mother  PAST MEDICAL HISTORY: Past Medical History:  Diagnosis Date  . Cervical dysplasia   . History of migraines   . Hyperlipidemia   . Urticaria   . Vitamin D deficiency     MEDICATIONS: Current Outpatient Medications on File Prior to Visit  Medication Sig Dispense Refill  . azithromycin (ZITHROMAX Z-PAK) 250 MG tablet Take 2 tablets (500 mg) on  Day 1,  followed by 1 tablet (250 mg) once daily on Days 2 through 5. 6 each 0  . rizatriptan (MAXALT) 10 MG tablet Take 1 tablet (10 mg total) by mouth as needed for migraine. May repeat in 2 hours if needed 10 tablet 1  . rosuvastatin (CRESTOR) 10 MG tablet TAKE 1 TABLET Collins DAY AT SUPPER  90 tablet 3  . triamcinolone cream (KENALOG) 0.1 % APPLY TO RASH 2 OR 3 TIMES A DAY UNTIL HEALED 30 g 1   No current facility-administered medications on file prior to visit.    ALLERGIES: Allergies  Allergen Reactions  . Sulfa Antibiotics Rash    FAMILY HISTORY: Family History  Problem Relation Age of Onset  . Cancer Father   . Colon cancer Neg Hx   . Esophageal cancer Neg Hx   . Rectal cancer Neg  Hx   . Stomach cancer Neg Hx    ***.  SOCIAL HISTORY: Social History   Socioeconomic History  . Marital status: Married    Spouse name: Not on file  . Number of children: Not on file  . Years of education: Not on file  . Highest education level: Not on file  Occupational History  . Not on file  Tobacco Use  . Smoking status: Never Smoker  . Smokeless tobacco: Never Used  Substance and Sexual Activity  . Alcohol use: No  . Drug use: No  . Sexual activity: Not on file  Other Topics Concern  . Not on file  Social History Narrative   Right handed   Social Determinants of Health   Financial Resource Strain: Not on file  Food Insecurity: Not on file  Transportation Needs: Not on file  Physical Activity: Not on file  Stress: Not on file  Social Connections: Not on file  Intimate Partner Violence: Not on file     Objective:  *** General: No acute distress.  Patient appears ***-groomed.   Head:  Normocephalic/atraumatic Eyes:  Fundi examined but not visualized Neck: supple, no paraspinal tenderness, full range of motion Heart:  Regular rate and rhythm Lungs:  Clear to auscultation bilaterally Back: No paraspinal tenderness Neurological Exam: alert and oriented to person, place, and time. Attention span and concentration intact, recent and remote memory intact, fund of knowledge intact.  Speech fluent and not dysarthric, language intact.  CN II-XII intact. Bulk and tone normal, muscle strength 5/5 throughout.  Sensation to light touch, temperature and vibration intact.  Deep tendon reflexes 2+ throughout, toes downgoing.  Finger to nose and heel to shin testing intact.  Gait normal, Romberg negative.   Assessment/Plan:   ***  Shon Millet, DO  CC: ***

## 2020-09-10 ENCOUNTER — Ambulatory Visit: Payer: Medicare Other | Admitting: Neurology

## 2020-11-12 ENCOUNTER — Telehealth: Payer: Self-pay | Admitting: Internal Medicine

## 2020-11-12 NOTE — Telephone Encounter (Signed)
Called patient back, Home COVID test was negative, symptoms started on Monday, she will take OTC medicines and will call back if she does not get better in a few days.

## 2020-11-12 NOTE — Telephone Encounter (Signed)
We have seen some return of cold viruses since not wearing masks as much. Take Covid test and use OTC meds for symptoms. I don't think she needs an antibiotic right now.Give it a day or two.

## 2020-11-12 NOTE — Telephone Encounter (Signed)
Carla Collins [phone]  Norita called to say her face hurts, forehead, eyes, congested, runny nose, little cough, especially when she lays down she has drainage, headache, no fever, No COVID exposure that she knows of, COVID vaccines and booster, she is going to go ahead and do a home COVID test

## 2021-05-04 ENCOUNTER — Telehealth: Payer: Self-pay

## 2021-05-04 ENCOUNTER — Telehealth (INDEPENDENT_AMBULATORY_CARE_PROVIDER_SITE_OTHER): Payer: Medicare Other | Admitting: Internal Medicine

## 2021-05-04 ENCOUNTER — Encounter: Payer: Self-pay | Admitting: Internal Medicine

## 2021-05-04 VITALS — Temp 97.7°F

## 2021-05-04 DIAGNOSIS — J0100 Acute maxillary sinusitis, unspecified: Secondary | ICD-10-CM | POA: Diagnosis not present

## 2021-05-04 DIAGNOSIS — Z8669 Personal history of other diseases of the nervous system and sense organs: Secondary | ICD-10-CM

## 2021-05-04 MED ORDER — AZITHROMYCIN 250 MG PO TABS: ORAL_TABLET | ORAL | 0 refills | Status: AC

## 2021-05-04 NOTE — Telephone Encounter (Signed)
Scheduled

## 2021-05-04 NOTE — Patient Instructions (Signed)
Take Zithromax Z-PAK 2 tabs day 1 followed by 1 tab days 2 through 5.  Call if not better in 7 to 10 days or sooner if worse.  Consider allergy testing for recurrent maxillary sinusitis issues.  Patient will think about it.

## 2021-05-04 NOTE — Progress Notes (Signed)
   Subjective:    Patient ID: Carla Collins, female    DOB: 1950-10-24, 70 y.o.   MRN: [id number]  HPI 70 year old Female seen via interactive audio and video telecommunications due to the Coronavirus pandemic.  She is agreeable to visit in this format today. She is identified using 2 identifiers as Carla Collins. Desautels, a longstanding patient in this practice.  She is at her home and I am at my office.  In  the Summer of 2021 she was having issues with morning headache.  She saw Dr. Tomi Likens, Neurologist, and had an MRI/MRA of the brain which proved to be negative.  She had an acute upper respiratory infection in August 2021.  COVID-19 was ruled out.  Was treated with a Zithromax Z-PAK.  In October 2021 she had an acute right serous otitis media and acute respiratory infection.  She was treated with Zithromax Z-PAK.  In March she called to say that her face hurt, forehead eyes and congestion was present with runny nose.  COVID test was negative.  Was advised to take over-the-counter medications and call if not better.  She called today complaining of sinus pressure, scratchy throat, puffy eyes.  She took time COVID test this morning and it was negative.  She reports she is afebrile and requested appointment.  I records indicates she has had for COVID-19 immunizations.  Today complaining of runny nose.  States she has discomfort behind her eyes and her forehead.  Has morning headache.  Says symptoms have been present for some 2 months.  She tried over-the-counter medication without relief.      Review of Systems no fever, chills, dysgeusia.     Objective:   Physical Exam She is afebrile.  She is seen virtually today.  Does not appear to be tachypneic.  Does not sound nasally congested.  Not heard to be coughing on video visit.       Assessment & Plan:  Acute maxillary sinusitis  History of migraine type headaches  Home COVID test today is negative and she has had 4 COVID vaccines.    Plan: Zithromax Z-PAK 2 tabs day 1 followed by 1 tab days 2 through 5.  Discussed possibility of having allergy testing due to recurrent sinus issues.  She will think about this.

## 2021-05-04 NOTE — Telephone Encounter (Signed)
Patient called has sinus pressure, scratchy throat, puffy eyes. T ook a COVID test this morning and it was negative.  Temp is 97.7.  She is requesting an appt.

## 2021-05-26 DIAGNOSIS — Z1231 Encounter for screening mammogram for malignant neoplasm of breast: Secondary | ICD-10-CM | POA: Diagnosis not present

## 2021-05-26 LAB — HM MAMMOGRAPHY

## 2021-05-28 ENCOUNTER — Encounter: Payer: Self-pay | Admitting: Internal Medicine

## 2021-06-01 ENCOUNTER — Other Ambulatory Visit: Payer: Medicare Other | Admitting: Internal Medicine

## 2021-06-01 ENCOUNTER — Other Ambulatory Visit: Payer: Self-pay

## 2021-06-01 DIAGNOSIS — E785 Hyperlipidemia, unspecified: Secondary | ICD-10-CM | POA: Diagnosis not present

## 2021-06-01 DIAGNOSIS — Z130 Encounter for screening for diseases of the blood and blood-forming organs and certain disorders involving the immune mechanism: Secondary | ICD-10-CM | POA: Diagnosis not present

## 2021-06-01 DIAGNOSIS — Z1329 Encounter for screening for other suspected endocrine disorder: Secondary | ICD-10-CM | POA: Diagnosis not present

## 2021-06-01 NOTE — Progress Notes (Signed)
Lab only 

## 2021-06-02 LAB — COMPREHENSIVE METABOLIC PANEL
AG Ratio: 1.2 (calc) (ref 1.0–2.5)
ALT: 16 U/L (ref 6–29)
AST: 21 U/L (ref 10–35)
Albumin: 4 g/dL (ref 3.6–5.1)
Alkaline phosphatase (APISO): 85 U/L (ref 37–153)
BUN: 15 mg/dL (ref 7–25)
CO2: 26 mmol/L (ref 20–32)
Calcium: 9.5 mg/dL (ref 8.6–10.4)
Chloride: 105 mmol/L (ref 98–110)
Creat: 0.9 mg/dL (ref 0.50–1.05)
Globulin: 3.3 g/dL (calc) (ref 1.9–3.7)
Glucose, Bld: 84 mg/dL (ref 65–99)
Potassium: 4.7 mmol/L (ref 3.5–5.3)
Sodium: 139 mmol/L (ref 135–146)
Total Bilirubin: 0.6 mg/dL (ref 0.2–1.2)
Total Protein: 7.3 g/dL (ref 6.1–8.1)

## 2021-06-02 LAB — LIPID PANEL
Cholesterol: 189 mg/dL (ref <200)
HDL: 72 mg/dL (ref >=50)
LDL Cholesterol (Calc): 102 mg/dL (calc) — ABNORMAL HIGH
Non-HDL Cholesterol (Calc): 117 mg/dL (calc) (ref <130)
Total CHOL/HDL Ratio: 2.6 (calc) (ref <5.0)
Triglycerides: 66 mg/dL (ref <150)

## 2021-06-02 LAB — TSH: TSH: 2.62 mIU/L (ref 0.40–4.50)

## 2021-06-02 LAB — CBC WITH DIFFERENTIAL/PLATELET
Absolute Monocytes: 381 cells/uL (ref 200–950)
Basophils Absolute: 61 cells/uL (ref 0–200)
Basophils Relative: 0.9 %
Eosinophils Absolute: 143 cells/uL (ref 15–500)
Eosinophils Relative: 2.1 %
HCT: 46 % — ABNORMAL HIGH (ref 35.0–45.0)
Hemoglobin: 15.1 g/dL (ref 11.7–15.5)
Lymphs Abs: 3951 cells/uL — ABNORMAL HIGH (ref 850–3900)
MCH: 30.3 pg (ref 27.0–33.0)
MCHC: 32.8 g/dL (ref 32.0–36.0)
MCV: 92.2 fL (ref 80.0–100.0)
MPV: 11.7 fL (ref 7.5–12.5)
Monocytes Relative: 5.6 %
Neutro Abs: 2264 cells/uL (ref 1500–7800)
Neutrophils Relative %: 33.3 %
Platelets: 276 10*3/uL (ref 140–400)
RBC: 4.99 10*6/uL (ref 3.80–5.10)
RDW: 13 % (ref 11.0–15.0)
Total Lymphocyte: 58.1 %
WBC: 6.8 10*3/uL (ref 3.8–10.8)

## 2021-06-08 ENCOUNTER — Encounter: Payer: Self-pay | Admitting: Internal Medicine

## 2021-06-08 ENCOUNTER — Other Ambulatory Visit: Payer: Self-pay

## 2021-06-08 ENCOUNTER — Ambulatory Visit (INDEPENDENT_AMBULATORY_CARE_PROVIDER_SITE_OTHER): Payer: Medicare Other | Admitting: Internal Medicine

## 2021-06-08 VITALS — BP 122/88 | HR 82 | Temp 98.1°F | Ht 66.0 in | Wt 197.0 lb

## 2021-06-08 DIAGNOSIS — N951 Menopausal and female climacteric states: Secondary | ICD-10-CM

## 2021-06-08 DIAGNOSIS — E7800 Pure hypercholesterolemia, unspecified: Secondary | ICD-10-CM

## 2021-06-08 DIAGNOSIS — Z0000 Encounter for general adult medical examination without abnormal findings: Secondary | ICD-10-CM | POA: Diagnosis not present

## 2021-06-08 DIAGNOSIS — Z8669 Personal history of other diseases of the nervous system and sense organs: Secondary | ICD-10-CM

## 2021-06-08 LAB — POCT URINALYSIS DIPSTICK
Bilirubin, UA: NEGATIVE
Blood, UA: NEGATIVE
Glucose, UA: NEGATIVE
Ketones, UA: NEGATIVE
Leukocytes, UA: NEGATIVE
Nitrite, UA: NEGATIVE
Protein, UA: NEGATIVE
Spec Grav, UA: 1.015 (ref 1.010–1.025)
Urobilinogen, UA: 0.2 E.U./dL
pH, UA: 8.5 — AB (ref 5.0–8.0)

## 2021-06-08 NOTE — Progress Notes (Signed)
Subjective:    Patient ID: Carla Collins, female    DOB: May 13, 1951, 70 y.o.   MRN: [id number]  HPI 70 year old Female seen for health maintenance exam, Medicare wellness, and evaluation of medical issues.  Her general health is excellent.  History of hyperlipidemia treated with Crestor.  History of vitamin D deficiency and migraine headaches.  Migraine headaches are treated with as needed Maxalt.  History of stress and urge urinary incontinence.  Had colonoscopy 2019 showing 2 polyps that were tubular adenomas that were removed and follow-up recommended in 5 years.  Her lipids are stable.  Total cholesterol 189, HDL 72, triglycerides 66 and LDL very slightly elevated at 102.  TSH and c-Met are normal.  CBC is within normal limits.  Social history: She is married.  She has a Scientist, water quality.  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  Does not smoke or consume alcohol.  1 daughter and 1 grandchild.  Family history: Mother died of dementia complications.  There is family history of breast cancer in first-degree relative.  Father with history of MI.  1 sister alive and well.  Mother had history of hypertension and history of breast cancer at age 13.  Recommend COVID booster.  Had flu vaccine in September.  Tetanus immunization up-to-date.  Colonoscopy is up to date next due 2024    Review of Systems-no new complaints-feels well.  In 2021 she was having morning headaches and had evaluation by Dr. Loistine Chance of the brain without contrast and MRA were normal.      Physical exam: Vital signs reviewed,Skin is warm and dry, nodes none, neck is supple without JVD thyromegaly or carotid bruits.  Breasts are without masses, cardiac exam: Regular rate and rhythm without ectopy or murmur.  Abdomen soft nondistended without hepatosplenomegaly masses or tenderness.  No lower extremity pitting edema.  Neuro is intact without gross focal deficits.    Assessment:   History  of migraine headaches treated with Maxalt.  Had Neurology evaluation 2021.  Imaging that was normal  Hyperlipidemia treated with Crestor 10 mg daily.  Lipids are stable.  Health maintenance: Recommend COVID booster this Fall, flu vaccine recommended: Annual mammogram recommended and bone density study ordered  Plan: Okay to refill Maxalt and Crestor for as needed 1 year.  Return in 1 year or as needed.   Plan:     Subjective:   Patient presents for Medicare Annual/Subsequent preventive examination.  Review Past Medical/Family/Social: see above   Risk Factors  Current exercise habits: regular Dietary issues discussed: yes  Cardiac risk factors:hyperlipidemia  Depression Screen  (Note: if answer to either of the following is "Yes", a more complete depression screening is indicated)   Over the past two weeks, have you felt down, depressed or hopeless? No  Over the past two weeks, have you felt little interest or pleasure in doing things? No Have you lost interest or pleasure in daily life? No Do you often feel hopeless? No Do you cry easily over simple problems? No   Activities of Daily Living  In your present state of health, do you have any difficulty performing the following activities?:   Driving? No  Managing money? No  Feeding yourself? No  Getting from bed to chair? No  Climbing a flight of stairs? No  Preparing food and eating?: No  Bathing or showering? No  Getting dressed: No  Getting to the toilet? No  Using the toilet:No  Moving  around from place to place: No  In the past year have you fallen or had a near fall?:No  Are you sexually active? yes Do you have more than one partner? No   Hearing Difficulties: No  Do you often ask people to speak up or repeat themselves? No  Do you experience ringing or noises in your ears? No  Do you have difficulty understanding soft or whispered voices? No  Do you feel that you have a problem with memory? No Do you often  misplace items? No    Home Safety:  Do you have a smoke alarm at your residence? Yes Do you have grab bars in the bathroom? No  Do you have throw rugs in your house? yes   Cognitive Testing  Alert? Yes Normal Appearance?Yes  Oriented to person? Yes Place? Yes  Time? Yes  Recall of three objects? Yes  Can perform simple calculations? Yes  Displays appropriate judgment?Yes  Can read the correct time from a watch face?Yes   List the Names of Other Physician/Practitioners you currently use:  See referral list for the physicians patient is currently seeing.     Review of Systems: see above   Objective:     General appearance: No acute distress Head: Normocephalic, without obvious abnormality, atraumatic  Eyes: conj clear, EOMi PEERLA  Ears: normal TM's and external ear canals both ears  Nose: Nares normal. Septum midline. Mucosa normal. No drainage or sinus tenderness.  Throat: lips, mucosa, and tongue normal; teeth and gums normal  Neck: no adenopathy, no carotid bruit, no JVD, supple, symmetrical, trachea midline and thyroid not enlarged, symmetric, no tenderness/mass/nodules  No CVA tenderness.  Lungs: clear to auscultation bilaterally  Breasts: normal appearance, no masses or tenderness Heart: regular rate and rhythm, S1, S2 normal, no murmur, click, rub or gallop  Abdomen: soft, non-tender; bowel sounds normal; no masses, no organomegaly  Musculoskeletal: ROM normal in all joints, no crepitus, no deformity, Normal muscle strengthen. Back  is symmetric, no curvature. Skin: Skin color, texture, turgor normal. No rashes or lesions  Lymph nodes: Cervical, supraclavicular, and axillary nodes normal.  Neurologic: CN 2 -12 Normal, Normal symmetric reflexes. Normal coordination and gait  Psych: Alert & Oriented x 3, Mood appear stable.    Assessment:    Annual wellness medicare exam   Plan:    During the course of the visit the patient was educated and counseled about  appropriate screening and preventive services including:   Mammogram  Bone density  Recommend COVID booster      Patient Instructions (the written plan) was given to the patient.  Medicare Attestation  I have personally reviewed:  The patient's medical and social history  Their use of alcohol, tobacco or illicit drugs  Their current medications and supplements  The patient's functional ability including ADLs,fall risks, home safety risks, cognitive, and hearing and visual impairment  Diet and physical activities  Evidence for depression or mood disorders  The patient's weight, height, BMI, and visual acuity have been recorded in the chart. I have made referrals, counseling, and provided education to the patient based on review of the above and I have provided the patient with a written personalized care plan for preventive services.

## 2021-07-26 ENCOUNTER — Telehealth: Payer: Self-pay

## 2021-07-26 NOTE — Telephone Encounter (Signed)
scheduled

## 2021-07-26 NOTE — Telephone Encounter (Signed)
Patient states that she has had a cough, sinus and chest congestion x 3 days. She denies fever, sore throat and headache.  No exposure to flu or covid known. She states covid test today was negative. Cough will not stop. She would like an appt. Please advise.

## 2021-07-27 ENCOUNTER — Other Ambulatory Visit: Payer: Self-pay

## 2021-07-27 ENCOUNTER — Ambulatory Visit (INDEPENDENT_AMBULATORY_CARE_PROVIDER_SITE_OTHER): Payer: Medicare Other | Admitting: Internal Medicine

## 2021-07-27 ENCOUNTER — Encounter: Payer: Self-pay | Admitting: Internal Medicine

## 2021-07-27 VITALS — HR 95 | Temp 99.3°F

## 2021-07-27 DIAGNOSIS — J101 Influenza due to other identified influenza virus with other respiratory manifestations: Secondary | ICD-10-CM | POA: Diagnosis not present

## 2021-07-27 DIAGNOSIS — R059 Cough, unspecified: Secondary | ICD-10-CM | POA: Diagnosis not present

## 2021-07-27 DIAGNOSIS — R509 Fever, unspecified: Secondary | ICD-10-CM

## 2021-07-27 LAB — POCT INFLUENZA A/B
Influenza A, POC: POSITIVE — AB
Influenza B, POC: NEGATIVE

## 2021-07-27 MED ORDER — OSELTAMIVIR PHOSPHATE 75 MG PO CAPS: 75.0000 mg | ORAL_CAPSULE | Freq: Two times a day (BID) | ORAL | 0 refills | Status: DC

## 2021-07-27 MED ORDER — BENZONATATE 100 MG PO CAPS: 100.0000 mg | ORAL_CAPSULE | Freq: Three times a day (TID) | ORAL | 0 refills | Status: DC | PRN

## 2021-07-27 MED ORDER — AZITHROMYCIN 250 MG PO TABS: ORAL_TABLET | ORAL | 0 refills | Status: AC

## 2021-07-27 NOTE — Progress Notes (Signed)
   Subjective:    Patient ID: Carla Collins, female    DOB: Feb 18, 1951, 70 y.o.   MRN: [id number]  HPI 70 year old Female seen for cough and congestion. No documented fever or shaking chills. Patient received Covid and flu vaccines received in September. Has flu-like symptoms with malaise and fatigue.Has had symptoms for about 3 days. Home Covid test was negative.  Her general health is excellent.  She has a history of hyperlipidemia treated with Crestor.  Had flu vaccine in September.  No known flu exposure or COVID-19 exposure     Review of Systems has malaise and fatigue     Objective:   Physical Exam  Temperature is 99.3 degrees pulse oximetry 95% pulse is 95 and regular.  Pharynx is slightly injected.  She looks fatigued.  She is not tachypneic.  TMs are clear.  Chest is clear to auscultation.  Rapid flu test is positive      Assessment & Plan:  Influenza A  Plan: Tamiflu 75 mg twice daily for 5 days.  Zithromax Z-PAK 2 tabs day 1 followed by 1 tab days 2 through 5.  Tessalon Perles 100 mg 3 times daily as needed for cough.  Rest and drink plenty of fluids.  Call if not improving in 48 hours or sooner if worse.  Quarantine for several days until symptoms improved.

## 2021-07-27 NOTE — Patient Instructions (Signed)
Take Tamiflu 75 mg twice daily for 5 days.  Tessalon Perles up to 3 times daily as needed for cough.  Zithromax Z-PAK 2 tabs day 1 followed by 1 tab days 2 through 5.  Rest and drink fluids.  Quarantine for several days until symptoms improved.

## 2021-08-01 LAB — SARS-COV-2 RNA (COVID-19) RESP VIRAL PNL QL NAAT
Adenovirus B: NOT DETECTED
HUMAN PARAINFLU VIRUS 1: NOT DETECTED
HUMAN PARAINFLU VIRUS 2: NOT DETECTED
HUMAN PARAINFLU VIRUS 3: NOT DETECTED
INFLUENZA A SUBTYPE H1: DETECTED — AB
INFLUENZA A SUBTYPE H3: NOT DETECTED
Influenza A: DETECTED — AB
Influenza B: NOT DETECTED
Metapneumovirus: NOT DETECTED
Respiratory Syncytial Virus A: NOT DETECTED
Respiratory Syncytial Virus B: NOT DETECTED
Rhinovirus: NOT DETECTED
SARS CoV2 RNA: NOT DETECTED

## 2021-08-01 LAB — TIQ-NTM

## 2021-11-30 ENCOUNTER — Other Ambulatory Visit: Payer: Self-pay

## 2021-11-30 MED ORDER — ROSUVASTATIN CALCIUM 10 MG PO TABS: ORAL_TABLET | ORAL | 3 refills | Status: DC

## 2021-12-06 ENCOUNTER — Ambulatory Visit (INDEPENDENT_AMBULATORY_CARE_PROVIDER_SITE_OTHER): Payer: Medicare Other | Admitting: Internal Medicine

## 2021-12-06 ENCOUNTER — Encounter: Payer: Self-pay | Admitting: Internal Medicine

## 2021-12-06 ENCOUNTER — Telehealth: Payer: Self-pay | Admitting: Internal Medicine

## 2021-12-06 VITALS — BP 124/86 | HR 92 | Resp 16 | Ht 66.0 in | Wt 197.8 lb

## 2021-12-06 DIAGNOSIS — H00014 Hordeolum externum left upper eyelid: Secondary | ICD-10-CM | POA: Diagnosis not present

## 2021-12-06 MED ORDER — OFLOXACIN 0.3 % OP SOLN: 2.0000 [drp] | Freq: Four times a day (QID) | OPHTHALMIC | 0 refills | Status: DC

## 2021-12-06 NOTE — Progress Notes (Signed)
? ?  Subjective:  ? ? Patient ID: Carla Collins, female    DOB: June 11, 1951, 71 y.o.   MRN: [id number] ? ?HPI 71 year old Female in excellent general health presents with left upper eyelid swelling onset over the weekend of April 1st. No significant drainage from left eye. Patient had hordeolum left upper eyelid seen at [facility] in September 2019 treated with Keflex and warm compresses. ? ?Patient has had minimal drainage from eyes with this episode.  No fever.  No URI symptoms.  No change in cosmetics. ? ? ? ?Review of Systems see above-says she is seeing minimal drainage from her eye. ? ?   ?Objective:  ? Physical Exam She is afebrile. ?Extraocular movements are full.  PERRLA.  She has hordeolum mid upper eyelid area that is erythematous but not draining. ? ? ? ?   ?Assessment & Plan:  ?Hordeolum left upper eyelid ? ?Plan: Warm compresses to eye for 20 minutes several times daily.  Ocuflox ophthalmic drops 2 drops in left eye 4 times a day for 5 days.  If not improving in 5 to 7 days, she is to see her Ophthalmologist to [facility]. ? ?

## 2021-12-06 NOTE — Patient Instructions (Addendum)
Apply warm compresses to left upper eyelid 20 minutes several times daily.  Use Ocuflox ophthalmic drops 2 drops in left eye 4 times a day for 5 days.  If not improving in 5 to 7 days see Ophthalmologist. ?

## 2021-12-06 NOTE — Telephone Encounter (Signed)
Tersea Aulds ?[phone] ? ?Carla Collins called to say she has what she is calling a cyst on her left eye lid that is swollen very sore and has puss in it. She would like to come in and let you look at it today. ?

## 2021-12-06 NOTE — Telephone Encounter (Signed)
scheduled

## 2021-12-17 DIAGNOSIS — H00024 Hordeolum internum left upper eyelid: Secondary | ICD-10-CM | POA: Diagnosis not present

## 2021-12-17 DIAGNOSIS — H10021 Other mucopurulent conjunctivitis, right eye: Secondary | ICD-10-CM | POA: Diagnosis not present

## 2022-06-02 DIAGNOSIS — Z1231 Encounter for screening mammogram for malignant neoplasm of breast: Secondary | ICD-10-CM | POA: Diagnosis not present

## 2022-06-02 LAB — HM MAMMOGRAPHY

## 2022-06-03 ENCOUNTER — Encounter: Payer: Self-pay | Admitting: Internal Medicine

## 2022-06-16 ENCOUNTER — Other Ambulatory Visit: Payer: Medicare Other

## 2022-06-16 DIAGNOSIS — Z789 Other specified health status: Secondary | ICD-10-CM

## 2022-06-16 DIAGNOSIS — Z8669 Personal history of other diseases of the nervous system and sense organs: Secondary | ICD-10-CM | POA: Diagnosis not present

## 2022-06-16 DIAGNOSIS — E7800 Pure hypercholesterolemia, unspecified: Secondary | ICD-10-CM

## 2022-06-16 DIAGNOSIS — R5383 Other fatigue: Secondary | ICD-10-CM

## 2022-06-16 DIAGNOSIS — E785 Hyperlipidemia, unspecified: Secondary | ICD-10-CM

## 2022-06-17 LAB — COMPLETE METABOLIC PANEL WITH GFR
AG Ratio: 1.1 (calc) (ref 1.0–2.5)
ALT: 14 U/L (ref 6–29)
AST: 20 U/L (ref 10–35)
Albumin: 4 g/dL (ref 3.6–5.1)
Alkaline phosphatase (APISO): 100 U/L (ref 37–153)
BUN: 16 mg/dL (ref 7–25)
CO2: 25 mmol/L (ref 20–32)
Calcium: 9.5 mg/dL (ref 8.6–10.4)
Chloride: 106 mmol/L (ref 98–110)
Creat: 0.93 mg/dL (ref 0.50–1.05)
Globulin: 3.5 g/dL (calc) (ref 1.9–3.7)
Glucose, Bld: 87 mg/dL (ref 65–99)
Potassium: 4.9 mmol/L (ref 3.5–5.3)
Sodium: 142 mmol/L (ref 135–146)
Total Bilirubin: 0.5 mg/dL (ref 0.2–1.2)
Total Protein: 7.5 g/dL (ref 6.1–8.1)
eGFR: 67 mL/min/{1.73_m2} (ref >=60)

## 2022-06-17 LAB — CBC WITH DIFFERENTIAL/PLATELET
Absolute Monocytes: 415 cells/uL (ref 200–950)
Basophils Absolute: 43 cells/uL (ref 0–200)
Basophils Relative: 0.7 %
Eosinophils Absolute: 93 cells/uL (ref 15–500)
Eosinophils Relative: 1.5 %
HCT: 43.4 % (ref 35.0–45.0)
Hemoglobin: 14.9 g/dL (ref 11.7–15.5)
Lymphs Abs: 3007 cells/uL (ref 850–3900)
MCH: 31 pg (ref 27.0–33.0)
MCHC: 34.3 g/dL (ref 32.0–36.0)
MCV: 90.2 fL (ref 80.0–100.0)
MPV: 11.3 fL (ref 7.5–12.5)
Monocytes Relative: 6.7 %
Neutro Abs: 2641 cells/uL (ref 1500–7800)
Neutrophils Relative %: 42.6 %
Platelets: 281 10*3/uL (ref 140–400)
RBC: 4.81 10*6/uL (ref 3.80–5.10)
RDW: 12.5 % (ref 11.0–15.0)
Total Lymphocyte: 48.5 %
WBC: 6.2 10*3/uL (ref 3.8–10.8)

## 2022-06-17 LAB — LIPID PANEL
Cholesterol: 178 mg/dL (ref <200)
HDL: 59 mg/dL (ref >=50)
LDL Cholesterol (Calc): 101 mg/dL (calc) — ABNORMAL HIGH
Non-HDL Cholesterol (Calc): 119 mg/dL (calc) (ref <130)
Total CHOL/HDL Ratio: 3 (calc) (ref <5.0)
Triglycerides: 85 mg/dL (ref <150)

## 2022-06-17 LAB — TSH: TSH: 2.92 mIU/L (ref 0.40–4.50)

## 2022-06-17 IMAGING — MR MR HEAD W/O CM
10 series · 48 of 48 positions shown · non-contrast
Comparison: None.

CLINICAL DATA: 68-year-old female with headache and blurred vision.
No known injury.

EXAM:
MRI HEAD WITHOUT CONTRAST
TECHNIQUE: Multiplanar, multiecho pulse sequences of the brain and surrounding
structures were obtained without intravenous contrast.

[Series 1: T1 · sagittal · 5.0mm · 0.45mm/px · 3 of 20 slices shown]
[im 1/20]
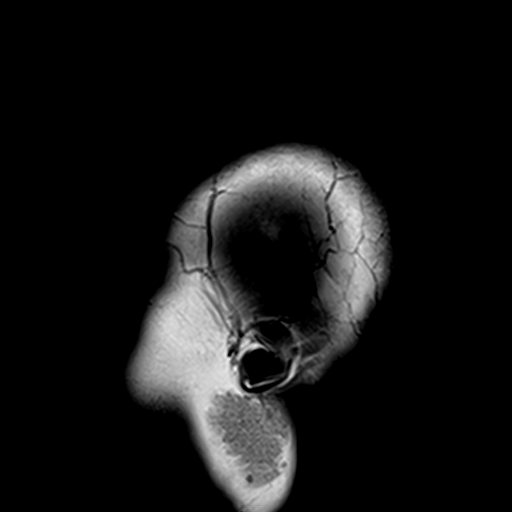
[im 10/20]
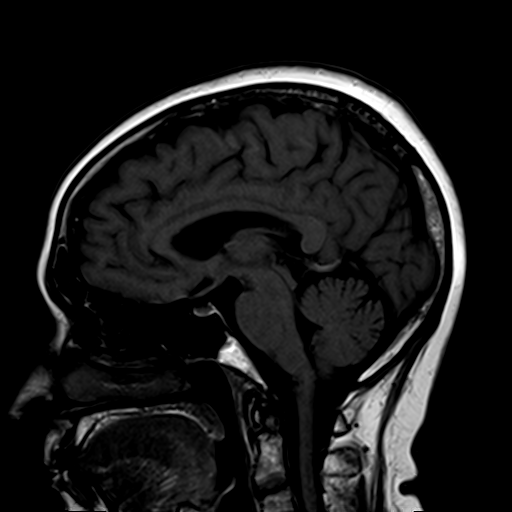
[im 20/20]
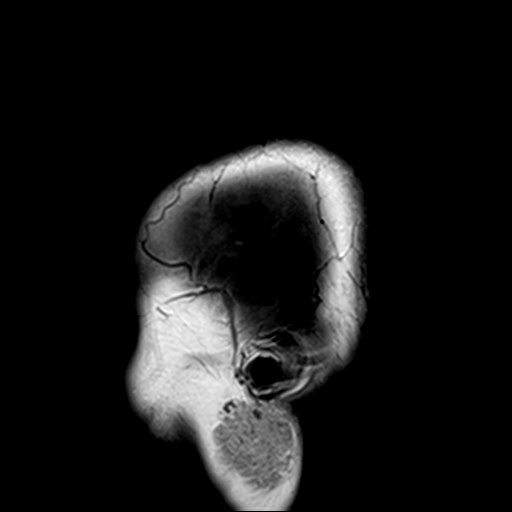

[Series 2: DWI · axial · 3.0mm · 1.80mm/px · z∈[-65,+82]mm · 9 of 100 slices shown (1 of 4)]
[im 1/100]
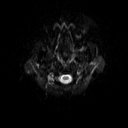
[im 13/100]
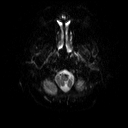
[im 25/100]
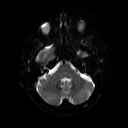
[im 38/100]
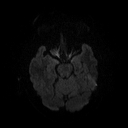
[im 50/100]
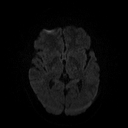
[im 62/100]
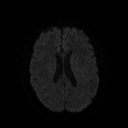
[im 75/100]
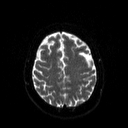
[im 87/100]
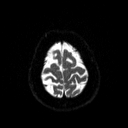
[im 100/100]
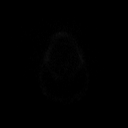

[Series 3: DWI · axial · 3.0mm · 1.80mm/px · z∈[-65,+82]mm · 4 of 50 slices shown (2 of 4)]
[im 1/50]
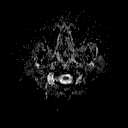
[im 17/50]
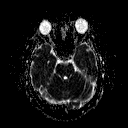
[im 33/50]
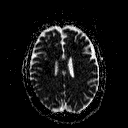
[im 50/50]
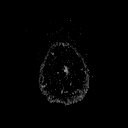

[Series 4: DWI · coronal · 5.0mm · 1.80mm/px · 6 of 71 slices shown (3 of 4)]
[im 1/71]
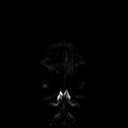
[im 15/71]
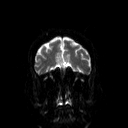
[im 29/71]
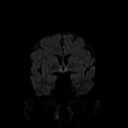
[im 43/71]
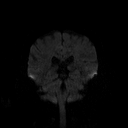
[im 57/71]
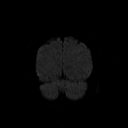
[im 71/71]
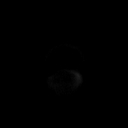

[Series 5: DWI · coronal · 5.0mm · 1.80mm/px · 3 of 36 slices shown (4 of 4)]
[im 1/36]
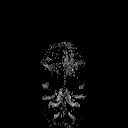
[im 18/36]
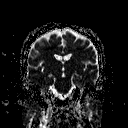
[im 36/36]
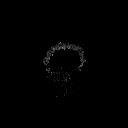

[Series 6: T2 · axial · 5.0mm · 0.51mm/px · z∈[-67,+80]mm · 2 of 22 slices shown (1 of 2)]
[im 1/22]
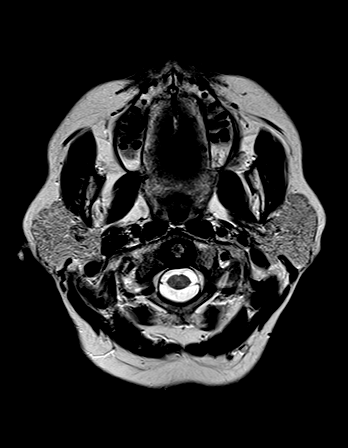
[im 22/22]
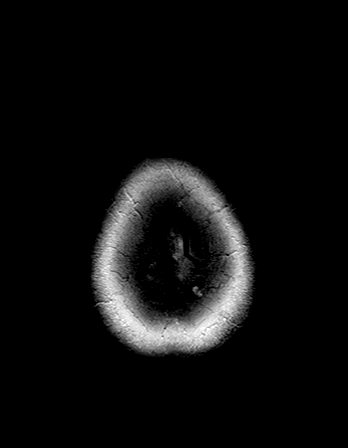

[Series 7: FLAIR · axial · 3.0mm · 0.45mm/px · z∈[-63,+80]mm · 3 of 32 slices shown]
[im 1/32]
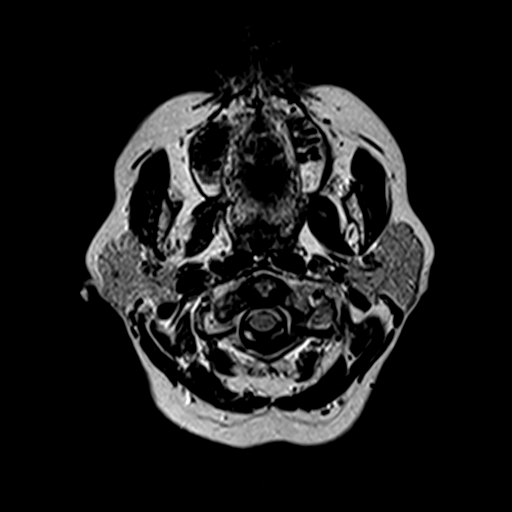
[im 16/32]
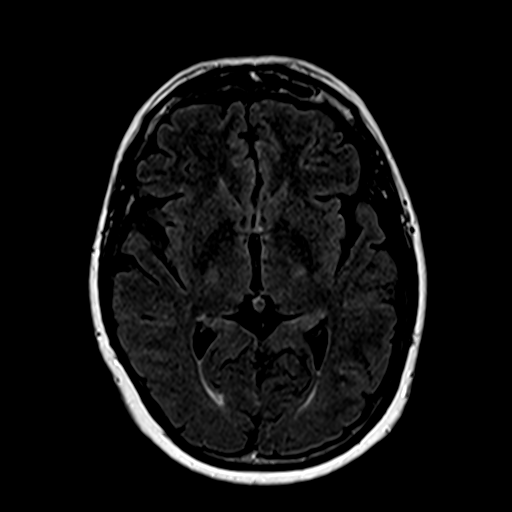
[im 32/32]
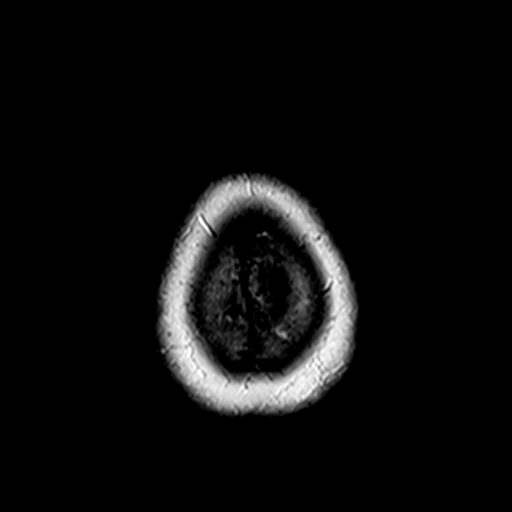

[Series 9: swi_images · axial · 4.0mm · 0.90mm/px · z∈[-62,+78]mm · 3 of 36 slices shown]
[im 1/36]
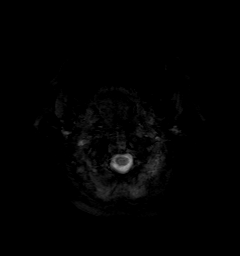
[im 18/36]
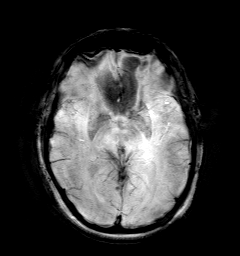
[im 36/36]
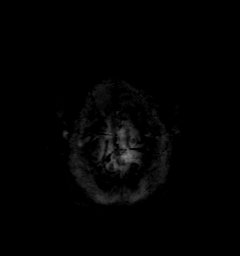

[Series 10: t1_mpr_tra · axial · 1.0mm · 0.71mm/px · z∈[-65,+77]mm · 13 of 144 slices shown]
[im 1/144]
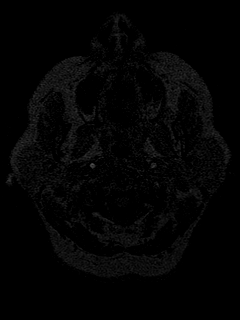
[im 12/144]
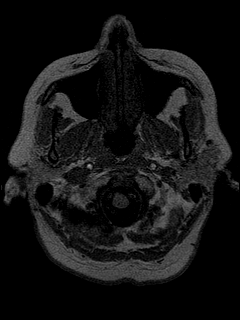
[im 24/144]
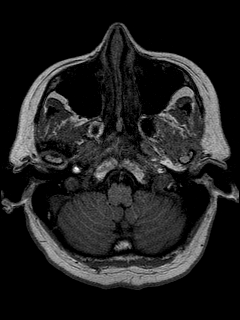
[im 36/144]
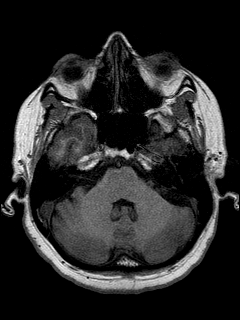
[im 48/144]
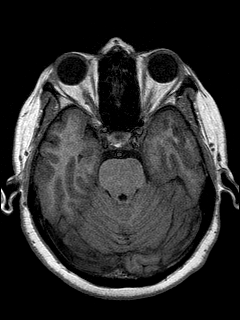
[im 60/144]
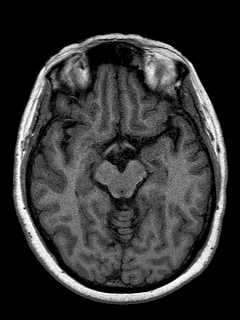
[im 72/144]
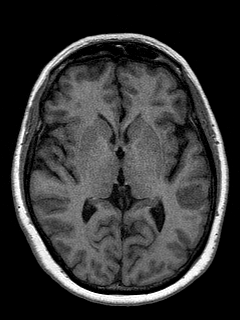
[im 84/144]
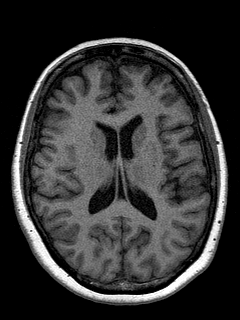
[im 96/144]
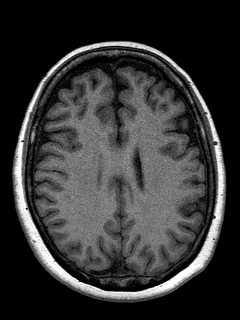
[im 108/144]
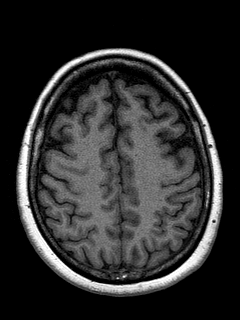
[im 120/144]
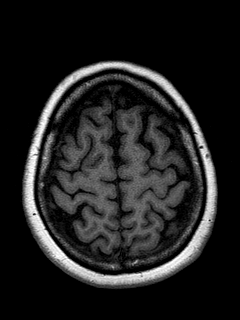
[im 132/144]
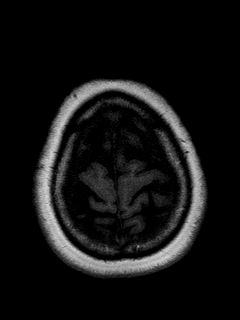
[im 144/144]
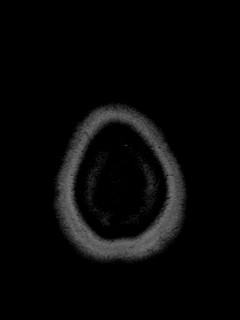

[Series 11: T2 · coronal · 5.0mm · 0.45mm/px · 2 of 27 slices shown (2 of 2)]
[im 1/27]
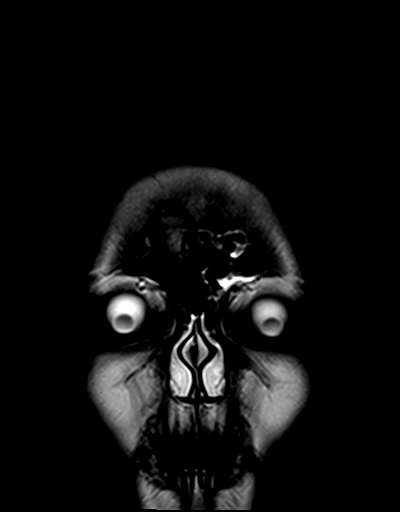
[im 27/27]
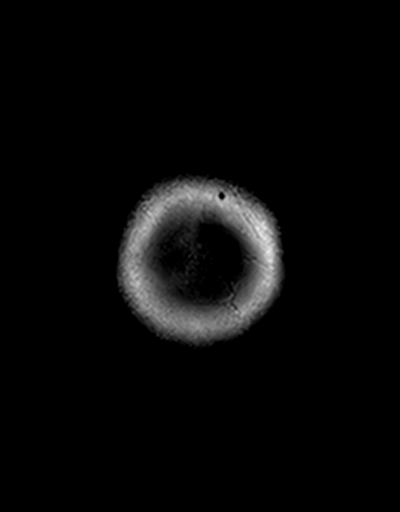

[48 of 48 positions shown; findings below may reference images not displayed]

FINDINGS: Brain: Cerebral volume is within normal limits for age. No
restricted diffusion to suggest acute infarction. No midline shift,
mass effect, evidence of mass lesion, ventriculomegaly, extra-axial
collection or acute intracranial hemorrhage. Cervicomedullary
junction and pituitary are within normal limits.

Gray and white matter signal is within normal limits for age
throughout the brain. No convincing encephalomalacia. No chronic
cerebral blood products.

Vascular: Major intracranial vascular flow voids Are preserved. MRA
today is reported separately.

Skull and upper cervical spine: Negative visible cervical spine.
Normal visible bone marrow signal.

Sinuses/Orbits: Negative orbits. Trace mucosal thickening in the
left frontal sinus, other paranasal sinuses are clear.

Other: Mastoids are clear.  Visible scalp and face appear negative.
IMPRESSION: 1. Normal for age noncontrast MRI appearance of the brain.
2. Trace mucosal thickening in the left frontal sinus, significance
doubtful.

## 2022-06-21 ENCOUNTER — Encounter: Payer: Self-pay | Admitting: Internal Medicine

## 2022-06-21 ENCOUNTER — Ambulatory Visit (INDEPENDENT_AMBULATORY_CARE_PROVIDER_SITE_OTHER): Payer: Medicare Other | Admitting: Internal Medicine

## 2022-06-21 VITALS — BP 114/72 | HR 94 | Temp 98.1°F | Ht 67.0 in | Wt 198.0 lb

## 2022-06-21 DIAGNOSIS — Z0000 Encounter for general adult medical examination without abnormal findings: Secondary | ICD-10-CM

## 2022-06-21 DIAGNOSIS — Z7185 Encounter for immunization safety counseling: Secondary | ICD-10-CM

## 2022-06-21 DIAGNOSIS — Z8639 Personal history of other endocrine, nutritional and metabolic disease: Secondary | ICD-10-CM | POA: Diagnosis not present

## 2022-06-21 DIAGNOSIS — Z8669 Personal history of other diseases of the nervous system and sense organs: Secondary | ICD-10-CM

## 2022-06-21 DIAGNOSIS — Z23 Encounter for immunization: Secondary | ICD-10-CM

## 2022-06-21 DIAGNOSIS — E7800 Pure hypercholesterolemia, unspecified: Secondary | ICD-10-CM | POA: Diagnosis not present

## 2022-06-21 LAB — POCT URINALYSIS DIPSTICK
Bilirubin, UA: NEGATIVE
Blood, UA: NEGATIVE
Glucose, UA: NEGATIVE
Ketones, UA: NEGATIVE
Leukocytes, UA: NEGATIVE
Nitrite, UA: NEGATIVE
Protein, UA: NEGATIVE
Spec Grav, UA: 1.01 (ref 1.010–1.025)
Urobilinogen, UA: 0.2 E.U./dL
pH, UA: 6 (ref 5.0–8.0)

## 2022-06-21 MED ORDER — PHENTERMINE HCL 15 MG PO CAPS: 15.0000 mg | ORAL_CAPSULE | ORAL | 0 refills | Status: DC

## 2022-06-21 NOTE — Progress Notes (Signed)
Annual Wellness Visit     Patient: Carla Collins, Female    DOB: [birth year], 71 y.o.   MRN: [id number] Visit Date: 06/21/2022   Subjective    Carla Collins is a 71 y.o. female who presents today for her Annual Wellness Visit.  She also presents for health maintenance exam and evaluation of medical issues.  Her general health is excellent.  She has a history of vitamin D deficiency, migraine headaches, stress and urge urinary incontinence, hyperlipidemia.  Hyperlipidemia treated with Crestor.  Migraine headaches treated with Maxalt.  She had colonoscopy in 2019 showing 2 polyps that were tubular adenomas.  These were removed and follow-up recommended in 5 years which will be next year.  Lipids are stable on Crestor..  Labs are within normal limits.  Tetanus booster is up-to-date.  Has had Shingrix series.  Had flu vaccine October 17.  Recommend pneumococcal 20 vaccine.  Recommend RSV vaccine if around children.  Received COVID booster October 2.  She is asking for small quantity of weight loss medication.  I have prescribed phentermine 30 mg 1 daily.  Social history: Married.  She has a Scientist, water quality.  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  Does not smoke or consume alcohol.  1 daughter and 1 grandchild.     Social History   Social History Narrative   Right handed    Patient Care Team: Tayveon Lombardo, Cresenciano Lick, MD as PCP - General (Internal Medicine)  Review of Systems no new complaints   Objective    Vitals: BP 114/72   Pulse 94   Temp 98.1 F (36.7 C) (Tympanic)   Ht '5\' 7"'$  (1.702 m)   Wt 198 lb (89.8 kg)   SpO2 97%   BMI 31.01 kg/m   Physical Exam Skin: Warm and dry.  Nodes none.  TMs and pharynx are clear.  Pharynx is clear.  Neck is supple without JVD thyromegaly or carotid bruits.  Chest is clear.  Cardiac exam: Regular rate and rhythm.  No to PT or murmur.  Breast are without masses.  Abdomen is soft nondistended without  hepatosplenomegaly masses or tenderness.  No lower extremity edema.  Brief neurological exam intact without gross focal deficits.   Most recent functional status assessment:    06/21/2022    2:04 PM  In your present state of health, do you have any difficulty performing the following activities:  Hearing? 0  Vision? 0  Difficulty concentrating or making decisions? 0  Walking or climbing stairs? 0  Dressing or bathing? 0  Doing errands, shopping? 0  Preparing Food and eating ? N  Using the Toilet? N  In the past six months, have you accidently leaked urine? N  Do you have problems with loss of bowel control? N  Managing your Medications? N  Managing your Finances? N  Housekeeping or managing your Housekeeping? N   Most recent fall risk assessment:    06/21/2022    2:03 PM  Fall Risk   Falls in the past year? 0  Number falls in past yr: 0  Injury with Fall? 0  Risk for fall due to : No Fall Risks  Follow up Falls evaluation completed    Most recent depression screenings:    06/21/2022    2:03 PM 06/08/2021    2:14 PM  PHQ 2/9 Scores  PHQ - 2 Score 0 0   Most recent cognitive screening:    06/21/2022  2:05 PM  6CIT Screen  What Year? 0 points  What month? 0 points  What time? 0 points  Count back from 20 0 points  Months in reverse 0 points  Repeat phrase 0 points  Total Score 0 points       Assessment & Plan   Hyperlipidemia stable on Crestor  History of stress and urge urinary incontinence  History of migraine headaches treated with Maxalt  Vaccines discussed  History of vitamin D deficiency treated with vitamin D supplement  Plan: Return in 1 year or as needed.  Continue current medications.    Annual wellness visit done today including the all of the following: Reviewed patient's Family Medical History Reviewed and updated list of patient's medical providers Assessment of cognitive impairment was done Assessed patient's functional  ability Established a written schedule for health screening [place] Completed and Reviewed  Discussed health benefits of physical activity, and encouraged her to engage in regular exercise appropriate for her age and condition.         {I, Elby Showers, MD, have reviewed all documentation for this visit. The documentation on 07/05/22 for the exam, diagnosis, procedures, and orders are all accurate and complete.   LaVon Barron Alvine, CMA

## 2022-07-05 NOTE — Patient Instructions (Addendum)
It was a pleasure to see you today.  Vaccines discussed.  Labs are stable.  Continue current medications and follow-up in 1 year or as needed.  Colonoscopy due in 2024.  She has some concerns about Crestor but I think she should continue it.  Flu vaccine given.  Small quantity of phentermine given for weight loss.

## 2022-11-28 ENCOUNTER — Other Ambulatory Visit: Payer: Self-pay | Admitting: Internal Medicine

## 2023-01-04 DIAGNOSIS — K080 Exfoliation of teeth due to systemic causes: Secondary | ICD-10-CM | POA: Diagnosis not present

## 2023-05-25 ENCOUNTER — Encounter: Payer: Self-pay | Admitting: Internal Medicine

## 2023-05-25 ENCOUNTER — Telehealth: Payer: Self-pay | Admitting: Internal Medicine

## 2023-05-25 ENCOUNTER — Ambulatory Visit (INDEPENDENT_AMBULATORY_CARE_PROVIDER_SITE_OTHER): Payer: Medicare Other | Admitting: Internal Medicine

## 2023-05-25 VITALS — BP 120/80 | HR 80 | Temp 97.7°F | Ht 66.0 in | Wt 185.0 lb

## 2023-05-25 DIAGNOSIS — H6503 Acute serous otitis media, bilateral: Secondary | ICD-10-CM | POA: Diagnosis not present

## 2023-05-25 DIAGNOSIS — J22 Unspecified acute lower respiratory infection: Secondary | ICD-10-CM

## 2023-05-25 DIAGNOSIS — R051 Acute cough: Secondary | ICD-10-CM

## 2023-05-25 MED ORDER — AZITHROMYCIN 250 MG PO TABS: ORAL_TABLET | ORAL | 0 refills | Status: AC

## 2023-05-25 MED ORDER — HYDROCODONE BIT-HOMATROP MBR 5-1.5 MG/5ML PO SOLN: 5.0000 mL | Freq: Three times a day (TID) | ORAL | 0 refills | Status: DC | PRN

## 2023-05-25 NOTE — Progress Notes (Signed)
Patient Care Team: Margaree Mackintosh, MD as PCP - General (Internal Medicine)  Visit Date: 05/25/23  Subjective:    Patient ID: Carla Collins , Female   DOB: 03-16-1951, 72 y.o.    MRN: [id number]   72 y.o. Female presents today for rhinorrhea, head congestion, sore throat, cough without sputum since 05/22/23. She has been traveling recently and going to large social events. Cough has been worsening and she can feel phlegm in her throat. Reports negative at-home Covid-19 test.  Past Medical History:  Diagnosis Date   Cervical dysplasia    History of migraines    Hyperlipidemia    Urticaria    Vitamin D deficiency      Family History  Problem Relation Age of Onset   Cancer Father    Colon cancer Neg Hx    Esophageal cancer Neg Hx    Rectal cancer Neg Hx    Stomach cancer Neg Hx     Social History   Social History Narrative   Right handed      Review of Systems  Constitutional:  Negative for fever and malaise/fatigue.  HENT:  Positive for congestion (Head) and sore throat.        (+) Rhinorrhea  Eyes:  Negative for blurred vision.  Respiratory:  Positive for cough. Negative for sputum production and shortness of breath.   Cardiovascular:  Negative for chest pain, palpitations and leg swelling.  Gastrointestinal:  Negative for vomiting.  Musculoskeletal:  Negative for back pain.  Skin:  Negative for rash.  Neurological:  Negative for loss of consciousness and headaches.        Objective:   Vitals: BP 120/80   Pulse 80   Temp 97.7 F (36.5 C) (Tympanic)   Ht 5\' 6"  (1.676 m)   Wt 185 lb (83.9 kg)   SpO2 98%   BMI 29.86 kg/m    Physical Exam Vitals and nursing note reviewed.  Constitutional:      General: She is not in acute distress.    Appearance: Normal appearance. She is not toxic-appearing.  HENT:     Head: Normocephalic and atraumatic.     Right Ear: Hearing, ear canal and external ear normal.     Left Ear: Hearing, ear canal and external ear  normal.     Ears:     Comments: Right and left TM's slightly full, pink.    Mouth/Throat:     Pharynx: Oropharynx is clear. No oropharyngeal exudate.  Pulmonary:     Effort: Pulmonary effort is normal. No respiratory distress.     Breath sounds: Normal breath sounds. No wheezing or rales.  Skin:    General: Skin is warm and dry.  Neurological:     Mental Status: She is alert and oriented to person, place, and time. Mental status is at baseline.  Psychiatric:        Mood and Affect: Mood normal.        Behavior: Behavior normal.        Thought Content: Thought content normal.        Judgment: Judgment normal.       Results:   Studies obtained and personally reviewed by me:   Labs:       Component Value Date/Time   NA 142 06/16/2022 0953   K 4.9 06/16/2022 0953   CL 106 06/16/2022 0953   CO2 25 06/16/2022 0953   GLUCOSE 87 06/16/2022 0953   BUN 16 06/16/2022 0953  CREATININE 0.93 06/16/2022 0953   CALCIUM 9.5 06/16/2022 0953   PROT 7.5 06/16/2022 0953   ALBUMIN 3.7 10/25/2016 1029   AST 20 06/16/2022 0953   ALT 14 06/16/2022 0953   ALKPHOS 105 10/25/2016 1029   BILITOT 0.5 06/16/2022 0953   GFRNONAA 64 05/14/2020 1127   GFRAA 74 05/14/2020 1127     Lab Results  Component Value Date   WBC 6.2 06/16/2022   HGB 14.9 06/16/2022   HCT 43.4 06/16/2022   MCV 90.2 06/16/2022   PLT 281 06/16/2022    Lab Results  Component Value Date   CHOL 178 06/16/2022   HDL 59 06/16/2022   LDLCALC 101 (H) 06/16/2022   TRIG 85 06/16/2022   CHOLHDL 3.0 06/16/2022    Lab Results  Component Value Date   HGBA1C 5.9 (H) 12/30/2013     Lab Results  Component Value Date   TSH 2.92 06/16/2022      Assessment & Plan:   Bilateral serous otitis media; Acute lower respiratory infection: prescribed Z-Pak two tabs day 1 followed by one tab days 2-5, Hycodan 1 tsp every 8 hours as needed for cough. Get plenty of rest and stay well-hydrated. Contact us if symptoms worsen or do  not improve.  Vaccine counseling: will delay Covid-19, flu vaccines until October.    I,Alexander Ruley,acting as a Neurosurgeon for Margaree Mackintosh, MD.,have documented all relevant documentation on the behalf of Margaree Mackintosh, MD,as directed by  Margaree Mackintosh, MD while in the presence of Margaree Mackintosh, MD.   I, Margaree Mackintosh, MD, have reviewed all documentation for this visit. The documentation on 05/27/23 for the exam, diagnosis, procedures, and orders are all accurate and complete.

## 2023-05-25 NOTE — Patient Instructions (Addendum)
Take Zithromax Z pak 2 tabs by mouth or day 1 then take one tab by mouth days 2-5.  Take Hycodan cough syrup one tsp every 8 hours as needed for cough.  Rest and stay well-hydrated.  Call if not improving in 48 hours or sooner if worse.

## 2023-05-25 NOTE — Telephone Encounter (Signed)
Carla Collins  [id number]  [name] called to say she started getting sick with a cough on Monday and now she has scratchy throat, stuffy head, runny nose, she took COVID test this morning and it was negative, so I scheduled her to come in at 10:30 and ask to wear mask. Cough keep her up last night.

## 2023-05-30 DIAGNOSIS — H5203 Hypermetropia, bilateral: Secondary | ICD-10-CM | POA: Diagnosis not present

## 2023-06-08 DIAGNOSIS — Z1231 Encounter for screening mammogram for malignant neoplasm of breast: Secondary | ICD-10-CM | POA: Diagnosis not present

## 2023-06-08 LAB — HM MAMMOGRAPHY

## 2023-06-16 NOTE — Progress Notes (Shared)
Annual Wellness Visit    Patient Care Team: Margaree Mackintosh, MD as PCP - General (Internal Medicine)  Visit Date: 06/16/23   No chief complaint on file.   Subjective:   Patient: Carla Collins, Female    DOB: 05-Jun-1951, 72 y.o.   MRN: [id number]  Carla Collins is a 71 y.o. Female who presents today for her Annual Wellness Visit. History of cervical dysplasia, migraine headaches, hyperlipidemia, urticaria, Vitamin D deficiency.  History of hyperlipidemia treated with rosuvastatin 10 mg daily.  History of migraine headaches treated with rizatriptan 10 mg as needed.  History of stress and urge urinary incontinence.   Mammogram last completed 06/08/23. No mammographic evidence of malignancy. Repeat recommended in 2025.  Had colonoscopy 2019 showing 2 polyps that were tubular adenomas that were removed and follow-up recommended in 5 years.   Social history: She is married. 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 1 daughter and 1 grandchild.  Family history: Mother died of dementia complications. There is family history of breast cancer in first-degree relative. Father with history of MI. 1 sister alive and well. Mother had history of hypertension and history of breast cancer at age 34.    Vaccine counseling: UTD on tetanus, shingles vaccines.  Past Medical History:  Diagnosis Date   Cervical dysplasia    History of migraines    Hyperlipidemia    Urticaria    Vitamin D deficiency      Family History  Problem Relation Age of Onset   Cancer Father    Colon cancer Neg Hx    Esophageal cancer Neg Hx    Rectal cancer Neg Hx    Stomach cancer Neg Hx      Social History   Social History Narrative   Right handed     ROS    Objective:   Vitals: There were no vitals taken for this visit.  Physical Exam   Most recent functional status assessment:    06/21/2022     2:04 PM  In your present state of health, do you have any difficulty performing the following activities:  Hearing? 0  Vision? 0  Difficulty concentrating or making decisions? 0  Walking or climbing stairs? 0  Dressing or bathing? 0  Doing errands, shopping? 0  Preparing Food and eating ? N  Using the Toilet? N  In the past six months, have you accidently leaked urine? N  Do you have problems with loss of bowel control? N  Managing your Medications? N  Managing your Finances? N  Housekeeping or managing your Housekeeping? N   Most recent fall risk assessment:    06/21/2022    2:03 PM  Fall Risk   Falls in the past year? 0  Number falls in past yr: 0  Injury with Fall? 0  Risk for fall due to : No Fall Risks  Follow up Falls evaluation completed    Most recent depression screenings:    06/21/2022    2:03 PM 06/08/2021    2:14 PM  PHQ 2/9 Scores  PHQ - 2 Score 0 0   Most recent cognitive screening:    06/21/2022    2:05 PM  6CIT Screen  What Year? 0 points  What month? 0 points  What time? 0 points  Count back from 20 0 points  Months in reverse 0 points  Repeat phrase 0  points  Total Score 0 points     Results:   Studies obtained and personally reviewed by me:  Imaging, colonoscopy, mammogram, bone density scan, echocardiogram, heart cath, stress test, CT calcium score, etc. ***   Labs:       Component Value Date/Time   NA 142 06/16/2022 0953   K 4.9 06/16/2022 0953   CL 106 06/16/2022 0953   CO2 25 06/16/2022 0953   GLUCOSE 87 06/16/2022 0953   BUN 16 06/16/2022 0953   CREATININE 0.93 06/16/2022 0953   CALCIUM 9.5 06/16/2022 0953   PROT 7.5 06/16/2022 0953   ALBUMIN 3.7 10/25/2016 1029   AST 20 06/16/2022 0953   ALT 14 06/16/2022 0953   ALKPHOS 105 10/25/2016 1029   BILITOT 0.5 06/16/2022 0953   GFRNONAA 64 05/14/2020 1127   GFRAA 74 05/14/2020 1127     Lab Results  Component Value Date   WBC 6.2 06/16/2022   HGB 14.9 06/16/2022   HCT  43.4 06/16/2022   MCV 90.2 06/16/2022   PLT 281 06/16/2022    Lab Results  Component Value Date   CHOL 178 06/16/2022   HDL 59 06/16/2022   LDLCALC 101 (H) 06/16/2022   TRIG 85 06/16/2022   CHOLHDL 3.0 06/16/2022    Lab Results  Component Value Date   HGBA1C 5.9 (H) 12/30/2013     Lab Results  Component Value Date   TSH 2.92 06/16/2022     No results found for: "PSA1", "PSA" *** delete for female pts  ***  Assessment & Plan:   ***      Annual wellness visit done today including the all of the following: Reviewed patient's Family Medical History Reviewed and updated list of patient's medical providers Assessment of cognitive impairment was done Assessed patient's functional ability Established a written schedule for health screening services Health Risk Assessent Completed and Reviewed  Discussed health benefits of physical activity, and encouraged her to engage in regular exercise appropriate for her age and condition.        I,Alexander Ruley,acting as a Neurosurgeon for Margaree Mackintosh, MD.,have documented all relevant documentation on the behalf of Margaree Mackintosh, MD,as directed by  Margaree Mackintosh, MD while in the presence of Margaree Mackintosh, MD.   ***

## 2023-06-20 ENCOUNTER — Other Ambulatory Visit: Payer: Medicare Other

## 2023-06-20 DIAGNOSIS — Z13220 Encounter for screening for lipoid disorders: Secondary | ICD-10-CM

## 2023-06-20 DIAGNOSIS — Z1329 Encounter for screening for other suspected endocrine disorder: Secondary | ICD-10-CM

## 2023-06-20 DIAGNOSIS — Z0000 Encounter for general adult medical examination without abnormal findings: Secondary | ICD-10-CM

## 2023-06-21 LAB — CBC WITH DIFFERENTIAL/PLATELET
Absolute Lymphocytes: 2904 {cells}/uL (ref 850–3900)
Absolute Monocytes: 347 {cells}/uL (ref 200–950)
Basophils Absolute: 50 {cells}/uL (ref 0–200)
Basophils Relative: 0.9 %
Eosinophils Absolute: 77 {cells}/uL (ref 15–500)
Eosinophils Relative: 1.4 %
HCT: 46 % — ABNORMAL HIGH (ref 35.0–45.0)
Hemoglobin: 15 g/dL (ref 11.7–15.5)
MCH: 30.1 pg (ref 27.0–33.0)
MCHC: 32.6 g/dL (ref 32.0–36.0)
MCV: 92.4 fL (ref 80.0–100.0)
MPV: 11.4 fL (ref 7.5–12.5)
Monocytes Relative: 6.3 %
Neutro Abs: 2123 {cells}/uL (ref 1500–7800)
Neutrophils Relative %: 38.6 %
Platelets: 277 10*3/uL (ref 140–400)
RBC: 4.98 10*6/uL (ref 3.80–5.10)
RDW: 12.7 % (ref 11.0–15.0)
Total Lymphocyte: 52.8 %
WBC: 5.5 10*3/uL (ref 3.8–10.8)

## 2023-06-21 LAB — COMPLETE METABOLIC PANEL WITH GFR
AG Ratio: 1.1 (calc) (ref 1.0–2.5)
ALT: 17 U/L (ref 6–29)
AST: 22 U/L (ref 10–35)
Albumin: 3.9 g/dL (ref 3.6–5.1)
Alkaline phosphatase (APISO): 90 U/L (ref 37–153)
BUN: 18 mg/dL (ref 7–25)
CO2: 25 mmol/L (ref 20–32)
Calcium: 9.5 mg/dL (ref 8.6–10.4)
Chloride: 108 mmol/L (ref 98–110)
Creat: 0.96 mg/dL (ref 0.60–1.00)
Globulin: 3.4 g/dL (ref 1.9–3.7)
Glucose, Bld: 98 mg/dL (ref 65–99)
Potassium: 4.7 mmol/L (ref 3.5–5.3)
Sodium: 142 mmol/L (ref 135–146)
Total Bilirubin: 0.5 mg/dL (ref 0.2–1.2)
Total Protein: 7.3 g/dL (ref 6.1–8.1)
eGFR: 64 mL/min/{1.73_m2} (ref >=60)

## 2023-06-21 LAB — LIPID PANEL
Cholesterol: 173 mg/dL (ref <200)
HDL: 59 mg/dL (ref >=50)
LDL Cholesterol (Calc): 95 mg/dL
Non-HDL Cholesterol (Calc): 114 mg/dL (ref <130)
Total CHOL/HDL Ratio: 2.9 (calc) (ref <5.0)
Triglycerides: 94 mg/dL (ref <150)

## 2023-06-21 LAB — TSH: TSH: 2.41 m[IU]/L (ref 0.40–4.50)

## 2023-06-23 ENCOUNTER — Ambulatory Visit: Payer: Medicare Other | Admitting: Internal Medicine

## 2023-06-23 NOTE — Progress Notes (Shared)
Annual Wellness Visit    Patient Care Team: Margaree Mackintosh, MD as PCP - General (Internal Medicine)  Visit Date: 06/23/23   No chief complaint on file.   Subjective:   Patient: Carla Collins, Female    DOB: 1951/07/21, 72 y.o.   MRN: [id number]  Carla Collins is a 71 y.o. Female who presents today for her Annual Wellness Visit. History of cervical dysplasia, migraine headaches, hyperlipidemia, urticaria, Vitamin D deficiency.  History of hyperlipidemia treated with rosuvastatin 10 mg daily.  Her general health is excellent.  History of vitamin D deficiency and migraine headaches. Migraine headaches are treated with as needed Maxalt.  History of stress and urge urinary incontinence.   Mammogram normal on 06/08/23.  Had colonoscopy 2019 showing 2 polyps that were tubular adenomas that were removed and follow-up recommended in 5 years.  Social history: She is married. 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 1 daughter and 1 grandchild.  Family history: Mother died of dementia complications. There is family history of breast cancer in first-degree relative. Father with history of MI. 1 sister alive and well. Mother had history of hypertension and history of breast cancer at age 60.     Vaccine counseling: UTD on tetanus vaccine.  Past Medical History:  Diagnosis Date   Cervical dysplasia    History of migraines    Hyperlipidemia    Urticaria    Vitamin D deficiency      Family History  Problem Relation Age of Onset   Cancer Father    Colon cancer Neg Hx    Esophageal cancer Neg Hx    Rectal cancer Neg Hx    Stomach cancer Neg Hx      Social History   Social History Narrative   Right handed     ROS    Objective:   Vitals: There were no vitals taken for this visit.  Physical Exam   Most recent functional status assessment:     No data to display          Most recent fall risk assessment:    06/21/2022    2:03 PM  Fall Risk   Falls in the past year? 0  Number falls in past yr: 0  Injury with Fall? 0  Risk for fall due to : No Fall Risks  Follow up Falls evaluation completed    Most recent depression screenings:    06/21/2022    2:03 PM 06/08/2021    2:14 PM  PHQ 2/9 Scores  PHQ - 2 Score 0 0   Most recent cognitive screening:    06/21/2022    2:05 PM  6CIT Screen  What Year? 0 points  What month? 0 points  What time? 0 points  Count back from 20 0 points  Months in reverse 0 points  Repeat phrase 0 points  Total Score 0 points     Results:   Studies obtained and personally reviewed by me:  Imaging, colonoscopy, mammogram, bone density scan, echocardiogram, heart cath, stress test, CT calcium score, etc. ***   Labs:       Component Value Date/Time   NA 142 06/20/2023 1055   K 4.7 06/20/2023 1055   CL 108 06/20/2023 1055   CO2 25 06/20/2023 1055   GLUCOSE 98 06/20/2023 1055   BUN 18 06/20/2023 1055   CREATININE 0.96 06/20/2023 1055  CALCIUM 9.5 06/20/2023 1055   PROT 7.3 06/20/2023 1055   ALBUMIN 3.7 10/25/2016 1029   AST 22 06/20/2023 1055   ALT 17 06/20/2023 1055   ALKPHOS 105 10/25/2016 1029   BILITOT 0.5 06/20/2023 1055   GFRNONAA 64 05/14/2020 1127   GFRAA 74 05/14/2020 1127     Lab Results  Component Value Date   WBC 5.5 06/20/2023   HGB 15.0 06/20/2023   HCT 46.0 (H) 06/20/2023   MCV 92.4 06/20/2023   PLT 277 06/20/2023    Lab Results  Component Value Date   CHOL 173 06/20/2023   HDL 59 06/20/2023   LDLCALC 95 06/20/2023   TRIG 94 06/20/2023   CHOLHDL 2.9 06/20/2023    Lab Results  Component Value Date   HGBA1C 5.9 (H) 12/30/2013     Lab Results  Component Value Date   TSH 2.41 06/20/2023     No results found for: "PSA1", "PSA" *** delete for female pts  ***  Assessment & Plan:   ***      Annual wellness visit done today including the all of the  following: Reviewed patient's Family Medical History Reviewed and updated list of patient's medical providers Assessment of cognitive impairment was done Assessed patient's functional ability Established a written schedule for health screening services Health Risk Assessent Completed and Reviewed  Discussed health benefits of physical activity, and encouraged her to engage in regular exercise appropriate for her age and condition.        I,Alexander Ruley,acting as a Neurosurgeon for Margaree Mackintosh, MD.,have documented all relevant documentation on the behalf of Margaree Mackintosh, MD,as directed by  Margaree Mackintosh, MD while in the presence of Margaree Mackintosh, MD.   ***

## 2023-06-28 ENCOUNTER — Ambulatory Visit: Payer: Medicare Other

## 2023-06-30 ENCOUNTER — Ambulatory Visit: Payer: Medicare Other | Admitting: Internal Medicine

## 2023-06-30 ENCOUNTER — Ambulatory Visit (INDEPENDENT_AMBULATORY_CARE_PROVIDER_SITE_OTHER): Payer: Medicare Other | Admitting: Internal Medicine

## 2023-06-30 VITALS — BP 120/80 | HR 71 | Ht 66.0 in | Wt 196.0 lb

## 2023-06-30 DIAGNOSIS — E7800 Pure hypercholesterolemia, unspecified: Secondary | ICD-10-CM | POA: Diagnosis not present

## 2023-06-30 DIAGNOSIS — Z8669 Personal history of other diseases of the nervous system and sense organs: Secondary | ICD-10-CM | POA: Diagnosis not present

## 2023-06-30 DIAGNOSIS — Z8639 Personal history of other endocrine, nutritional and metabolic disease: Secondary | ICD-10-CM

## 2023-06-30 DIAGNOSIS — Z780 Asymptomatic menopausal state: Secondary | ICD-10-CM

## 2023-06-30 DIAGNOSIS — Z0000 Encounter for general adult medical examination without abnormal findings: Secondary | ICD-10-CM | POA: Diagnosis not present

## 2023-06-30 NOTE — Progress Notes (Signed)
Annual Wellness Visit    Patient Care Team: Margaree Mackintosh, MD as PCP - General (Internal Medicine)  Visit Date: 06/30/23   Chief Complaint  Patient presents with   Medicare Wellness    Subjective:   Patient: Carla Collins, Female    DOB: [birth year], 72 y.o.   MRN: [id number]  Carla Collins is a 72 y.o. Female who presents today for her Annual Wellness Visit. History of cervical dysplasia, migraine headaches, hyperlipidemia, urticaria, Vitamin D deficiency.   Had an acute lower respiratory infection in 05/2023 and is no longer having symptoms.  History of hyperlipidemia treated with rosuvastatin 10 mg daily. Lipid panel normal.   Her general health is excellent.  History of vitamin D deficiency and migraine headaches. Migraine headaches are treated with as needed Maxalt.  History of stress and urge urinary incontinence.   Labs reviewed today. Glucose normal. Kidney, liver functions normal. Electrolytes normal. Blood proteins normal. HCT elevated at 46. TSH at 2.41.  Mammogram normal on 06/08/23.   Had colonoscopy 2019 showing 2 polyps that were tubular adenomas that were removed and follow-up recommended in 5 years.   Social history: She is married. 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 1 daughter and 1 grandchild.  Family history: Mother died of dementia complications. There is family history of breast cancer in first-degree relative. Father with history of MI. 1 sister alive and well. Mother had history of hypertension and history of breast cancer at age 7.   Past Medical History:  Diagnosis Date   Cervical dysplasia    History of migraines    Hyperlipidemia    Urticaria    Vitamin D deficiency      Family History  Problem Relation Age of Onset   Cancer Father    Colon cancer Neg Hx    Esophageal cancer Neg Hx    Rectal cancer Neg Hx    Stomach cancer Neg Hx       Social Hx: reviewed and unchanged. Married.Has a Masters degree. Retired from [employer]. Does not smoke or consume alcohol. One daughter and one grandchild.    Review of Systems  Constitutional:  Negative for chills, fever, malaise/fatigue and weight loss.  HENT:  Negative for hearing loss, sinus pain and sore throat.   Respiratory:  Negative for cough, hemoptysis and shortness of breath.   Cardiovascular:  Negative for chest pain, palpitations, leg swelling and PND.  Gastrointestinal:  Negative for abdominal pain, constipation, diarrhea, heartburn, nausea and vomiting.  Genitourinary:  Negative for dysuria, frequency and urgency.  Musculoskeletal:  Negative for back pain, myalgias and neck pain.  Skin:  Negative for itching and rash.  Neurological:  Negative for dizziness, tingling, seizures and headaches.  Endo/Heme/Allergies:  Negative for polydipsia.  Psychiatric/Behavioral:  Negative for depression. The patient is not nervous/anxious.       Objective:   Vitals: BP 120/80   Pulse 71   Ht 5\' 6"  (1.676 m)   Wt 196 lb (88.9 kg)   SpO2 97%   BMI 31.64 kg/m   Physical Exam Vitals and nursing note reviewed.  Constitutional:      General: She is not in acute distress.    Appearance: Normal appearance. She is not ill-appearing or toxic-appearing.  HENT:     Head: Normocephalic and atraumatic.     Right Ear: Hearing, tympanic membrane, ear canal and external  ear normal.     Left Ear: Hearing, tympanic membrane, ear canal and external ear normal.     Mouth/Throat:     Pharynx: Oropharynx is clear.  Eyes:     Extraocular Movements: Extraocular movements intact.     Pupils: Pupils are equal, round, and reactive to light.  Neck:     Thyroid: No thyroid mass, thyromegaly or thyroid tenderness.     Vascular: No carotid bruit.  Cardiovascular:     Rate and Rhythm: Normal rate and regular rhythm. No extrasystoles are present.    Pulses:          Dorsalis pedis pulses are 1+  on the right side and 1+ on the left side.     Heart sounds: Normal heart sounds. No murmur heard.    No friction rub. No gallop.  Pulmonary:     Effort: Pulmonary effort is normal.     Breath sounds: Normal breath sounds. No decreased breath sounds, wheezing, rhonchi or rales.  Chest:     Chest wall: No mass.  Abdominal:     Palpations: Abdomen is soft. There is no hepatomegaly, splenomegaly or mass.     Tenderness: There is no abdominal tenderness.     Hernia: No hernia is present.  Musculoskeletal:     Cervical back: Normal range of motion.     Right lower leg: No edema.     Left lower leg: No edema.  Lymphadenopathy:     Cervical: No cervical adenopathy.     Upper Body:     Right upper body: No supraclavicular adenopathy.     Left upper body: No supraclavicular adenopathy.  Skin:    General: Skin is warm and dry.  Neurological:     General: No focal deficit present.     Mental Status: She is alert and oriented to person, place, and time. Mental status is at baseline.     Sensory: Sensation is intact.     Motor: Motor function is intact. No weakness.     Deep Tendon Reflexes: Reflexes are normal and symmetric.  Psychiatric:        Attention and Perception: Attention normal.        Mood and Affect: Mood normal.        Speech: Speech normal.        Behavior: Behavior normal.        Thought Content: Thought content normal.        Cognition and Memory: Cognition normal.        Judgment: Judgment normal.      Most recent functional status assessment:    06/30/2023   10:03 AM  In your present state of health, do you have any difficulty performing the following activities:  Hearing? 0  Vision? 0  Difficulty concentrating or making decisions? 0  Walking or climbing stairs? 0  Dressing or bathing? 0  Doing errands, shopping? 0  Preparing Food and eating ? N  Using the Toilet? N  In the past six months, have you accidently leaked urine? N  Do you have problems with  loss of bowel control? N  Managing your Medications? N  Managing your Finances? N  Housekeeping or managing your Housekeeping? N   Most recent fall risk assessment:    06/30/2023   10:03 AM  Fall Risk   Falls in the past year? 0  Number falls in past yr: 0  Injury with Fall? 0  Risk for fall due to : No Fall  Risks  Follow up Falls evaluation completed    Most recent depression screenings:    06/21/2022    2:03 PM 06/08/2021    2:14 PM  PHQ 2/9 Scores  PHQ - 2 Score 0 0   Most recent cognitive screening:    06/30/2023   10:05 AM  6CIT Screen  What Year? 0 points  What month? 0 points  What time? 0 points  Count back from 20 0 points  Months in reverse 0 points  Repeat phrase 0 points  Total Score 0 points     Results:   Studies obtained and personally reviewed by me:  Mammogram normal on 06/08/23.   Had colonoscopy 2019 showing 2 polyps that were tubular adenomas that were removed and follow-up recommended in 5 years.  Labs:       Component Value Date/Time   NA 142 06/20/2023 1055   K 4.7 06/20/2023 1055   CL 108 06/20/2023 1055   CO2 25 06/20/2023 1055   GLUCOSE 98 06/20/2023 1055   BUN 18 06/20/2023 1055   CREATININE 0.96 06/20/2023 1055   CALCIUM 9.5 06/20/2023 1055   PROT 7.3 06/20/2023 1055   ALBUMIN 3.7 10/25/2016 1029   AST 22 06/20/2023 1055   ALT 17 06/20/2023 1055   ALKPHOS 105 10/25/2016 1029   BILITOT 0.5 06/20/2023 1055   GFRNONAA 64 05/14/2020 1127   GFRAA 74 05/14/2020 1127     Lab Results  Component Value Date   WBC 5.5 06/20/2023   HGB 15.0 06/20/2023   HCT 46.0 (H) 06/20/2023   MCV 92.4 06/20/2023   PLT 277 06/20/2023    Lab Results  Component Value Date   CHOL 173 06/20/2023   HDL 59 06/20/2023   LDLCALC 95 06/20/2023   TRIG 94 06/20/2023   CHOLHDL 2.9 06/20/2023    Lab Results  Component Value Date   HGBA1C 5.9 (H) 12/30/2013     Lab Results  Component Value Date   TSH 2.41 06/20/2023    Assessment &  Plan:   Her general health is excellent.  Hyperlipidemia: treated with rosuvastatin 10 mg daily. Lipid panel normal.  Migraine headaches: treated with as needed Maxalt.  History of vitamin D deficiency.   History of stress and urge urinary incontinence.   Mammogram normal on 06/08/23.   Had colonoscopy 2019 showing 2 polyps that were tubular adenomas that were removed and follow-up recommended in 5 years.  New order placed for bone density.  Vaccine counseling: UTD on tetanus vaccine. She will go to the pharmacy for RSV vaccine. Reports she is UTD on Covid-19 booster.  Return in 1 year or as needed.     Annual wellness visit done today including the all of the following: Reviewed patient's Family Medical History Reviewed and updated list of patient's medical providers Assessment of cognitive impairment was done Assessed patient's functional ability Established a written schedule for health screening services Health Risk Assessent Completed and Reviewed  Discussed health benefits of physical activity, and encouraged her to engage in regular exercise appropriate for her age and condition.        I,Alexander Ruley,acting as a Neurosurgeon for Margaree Mackintosh, MD.,have documented all relevant documentation on the behalf of Margaree Mackintosh, MD,as directed by  Margaree Mackintosh, MD while in the presence of Margaree Mackintosh, MD.   I, Margaree Mackintosh, MD, have reviewed all documentation for this visit. The documentation on 07/06/23 for the exam, diagnosis, procedures, and orders are all  accurate and complete.

## 2023-07-06 NOTE — Patient Instructions (Signed)
It was a pleasure to see you today.  Continue rosuvastatin for hyperlipidemia.  May continue with Maxalt as needed for episodic migraine headaches.  Continue vitamin D supplement.  Vaccine counseling performed.  Recommend RSV vaccine.  Reports she is up-to-date on COVID-19 booster and tetanus immunization.  Colonoscopy due.  Have placed order for bone density study.  Return in 1 year or as needed.

## 2023-07-11 DIAGNOSIS — K080 Exfoliation of teeth due to systemic causes: Secondary | ICD-10-CM | POA: Diagnosis not present

## 2023-08-23 ENCOUNTER — Encounter: Payer: Self-pay | Admitting: Gastroenterology

## 2023-08-24 DIAGNOSIS — M79674 Pain in right toe(s): Secondary | ICD-10-CM | POA: Diagnosis not present

## 2023-09-18 ENCOUNTER — Ambulatory Visit (INDEPENDENT_AMBULATORY_CARE_PROVIDER_SITE_OTHER): Payer: Medicare Other | Admitting: Internal Medicine

## 2023-09-18 ENCOUNTER — Encounter: Payer: Self-pay | Admitting: Internal Medicine

## 2023-09-18 VITALS — BP 110/80 | HR 78 | Temp 98.0°F | Ht 66.0 in | Wt 196.0 lb

## 2023-09-18 DIAGNOSIS — H6503 Acute serous otitis media, bilateral: Secondary | ICD-10-CM

## 2023-09-18 DIAGNOSIS — J0100 Acute maxillary sinusitis, unspecified: Secondary | ICD-10-CM

## 2023-09-18 MED ORDER — AZITHROMYCIN 250 MG PO TABS: ORAL_TABLET | ORAL | 0 refills | Status: DC

## 2023-09-18 MED ORDER — FLUCONAZOLE 150 MG PO TABS: 150.0000 mg | ORAL_TABLET | Freq: Once | ORAL | 0 refills | Status: AC

## 2023-09-18 MED ORDER — METHYLPREDNISOLONE ACETATE 80 MG/ML IJ SUSP
80.0000 mg | Freq: Once | INTRAMUSCULAR | Status: AC
  Administered 2023-09-18: 80 mg via INTRAMUSCULAR

## 2023-09-18 NOTE — Patient Instructions (Addendum)
 You have been diagnosed with acute serous otitis media of both ears and acute maxillary sinusitis.  Please take Zithromax  Z pak  2 tabs day 1 followed by 1 tab days 2-5.  You may take Diflucan  150 mg if needed for Candida vaginitis due to antibiotic treatment.  Also given Depomedrol 80 mg IM in office. Call if not better in 7-10 days or sooner if worse.

## 2023-09-18 NOTE — Progress Notes (Signed)
 Patient Care Team: Perri Ronal PARAS, MD as PCP - General (Internal Medicine)  Visit Date: 09/18/23  Subjective:   Chief Complaint  Patient presents with   Sinusitis    Headaches, face pain for 2 weeks off and on.    Patient PI:Antnwwj M Haverland,Female DOB:12/03/1950,73 y.o. FMW:[id number]   73 y.o. Female presents today for acute sick visit with complaint of possible Sinusitis. Patient has a past medical history of migraines. Endorses frontal headache with associated tenderness on palpation, facial pain intermittently for the past 2 weeks, as well as clear drainage. Notes her headache is mildly relieved after sitting up in the morning. Has been taking Sudafed and Ibuprofen for her symptoms. Denies sore throat or otalgia.  Allergies  Allergen Reactions   Sulfa Antibiotics Rash    Past Medical History:  Diagnosis Date   Cervical dysplasia    History of migraines    Hyperlipidemia    Urticaria    Vitamin D  deficiency     Family History  Problem Relation Age of Onset   Cancer Father    Colon cancer Neg Hx    Esophageal cancer Neg Hx    Rectal cancer Neg Hx    Stomach cancer Neg Hx    Social History   Social History Narrative   Right handed   Review of Systems  Constitutional:  Negative for fever and malaise/fatigue.  HENT:  Negative for congestion, ear pain and sore throat.   Eyes:  Negative for blurred vision.  Respiratory:  Positive for sputum production (clear). Negative for cough and shortness of breath.   Cardiovascular:  Negative for chest pain, palpitations and leg swelling.  Gastrointestinal:  Negative for vomiting.  Musculoskeletal:  Negative for back pain.  Skin:  Negative for rash.  Neurological:  Positive for headaches (frontal, with subsequent tenderness to palpation). Negative for loss of consciousness.     Objective:  Vitals: BP 110/80   Pulse 78   Temp 98 F (36.7 C)   Ht 5' 6 (1.676 m)   Wt 196 lb (88.9 kg)   SpO2 97%   BMI 31.64 kg/m    Physical Exam Vitals and nursing note reviewed.  Constitutional:      General: She is not in acute distress.    Appearance: Normal appearance. She is not toxic-appearing.  HENT:     Head: Normocephalic and atraumatic.     Comments: Bilateral anterior cervical nodes, mobile    Ears:     Comments: Bilateral TMs: slightly full and pink with splayed light reflex     Mouth/Throat:     Pharynx: Posterior oropharyngeal erythema (slightly) present. No oropharyngeal exudate.  Pulmonary:     Effort: Pulmonary effort is normal.  Skin:    General: Skin is warm and dry.  Neurological:     Mental Status: She is alert and oriented to person, place, and time. Mental status is at baseline.  Psychiatric:        Mood and Affect: Mood normal.        Behavior: Behavior normal.        Thought Content: Thought content normal.        Judgment: Judgment normal.     Results:  Studies Obtained And Personally Reviewed By Me: Labs:     Component Value Date/Time   NA 142 06/20/2023 1055   K 4.7 06/20/2023 1055   CL 108 06/20/2023 1055   CO2 25 06/20/2023 1055   GLUCOSE 98 06/20/2023 1055   BUN 18  06/20/2023 1055   CREATININE 0.96 06/20/2023 1055   CALCIUM  9.5 06/20/2023 1055   PROT 7.3 06/20/2023 1055   ALBUMIN 3.7 10/25/2016 1029   AST 22 06/20/2023 1055   ALT 17 06/20/2023 1055   ALKPHOS 105 10/25/2016 1029   BILITOT 0.5 06/20/2023 1055   GFRNONAA 64 05/14/2020 1127   GFRAA 74 05/14/2020 1127    Lab Results  Component Value Date   WBC 5.5 06/20/2023   HGB 15.0 06/20/2023   HCT 46.0 (H) 06/20/2023   MCV 92.4 06/20/2023   PLT 277 06/20/2023   Lab Results  Component Value Date   CHOL 173 06/20/2023   HDL 59 06/20/2023   LDLCALC 95 06/20/2023   TRIG 94 06/20/2023   CHOLHDL 2.9 06/20/2023   Lab Results  Component Value Date   HGBA1C 5.9 (H) 12/30/2013    Lab Results  Component Value Date   TSH 2.41 06/20/2023   Assessment & Plan:   Acute Bilateral Serous Otitis Media;  Acute Non-Recurrent Maxillary Sinusitis: Received 80 mg Depo-Medrol  in-office. Sending in 250 mg Azithromycin  - take 2 tablets on Day 1 and 1 tablet on Days 2-5 and 150 mg Diflucan  in case you develop Candida Vaginitis   I,Emily Lagle,acting as a scribe for Ronal JINNY Hailstone, MD.,have documented all relevant documentation on the behalf of Ronal JINNY Hailstone, MD,as directed by  Ronal JINNY Hailstone, MD while in the presence of Ronal JINNY Hailstone, MD.   I, Ronal JINNY Hailstone, MD, have reviewed all documentation for this visit. The documentation on 10/01/23 for the exam, diagnosis, procedures, and orders are all accurate and complete.

## 2023-09-25 ENCOUNTER — Ambulatory Visit (AMBULATORY_SURGERY_CENTER): Payer: Medicare Other | Admitting: *Deleted

## 2023-09-25 VITALS — Ht 66.0 in | Wt 190.0 lb

## 2023-09-25 DIAGNOSIS — Z860100 Personal history of colon polyps, unspecified: Secondary | ICD-10-CM

## 2023-09-25 MED ORDER — SUFLAVE 178.7 G PO SOLR: 1.0000 | ORAL | 0 refills | Status: DC

## 2023-09-25 NOTE — Progress Notes (Signed)
 Pt's name and DOB verified at the beginning of the pre-visit wit 2 identifiers  Pt denies any difficulty with ambulating,sitting, laying down or rolling side to side  Pt has no issues with ambulation   Pt has no issues moving head neck or swallowing  No egg or soy allergy known to patient   No issues known to pt with past sedation with any surgeries or procedures  Pt denies having issues being intubated  No FH of Malignant Hyperthermia  Pt is not on diet pills or shots  Pt is not on home 02   Pt is not on blood thinners   Pt denies issues with constipation   Pt is not on dialysis  Pt denise any abnormal heart rhythms   Pt denies any upcoming cardiac testing  Pt encouraged to use to use Singlecare or Goodrx to reduce cost   Patient's chart reviewed by Cathlyn Parsons CNRA prior to pre-visit and patient appropriate for the [facility].  Pre-visit completed and red dot placed by patient's name on their procedure day (on provider's schedule).  .  Visit by phone   Pt states weight is 190 lb   Instructed pt why it is important to and  to call if they have any changes in health or new medications. Directed them to the # given and on instructions.     Instructions reviewed. Pt given both [facility] main # and MD on call # prior to instructions.  Pt states understanding. Instructed to review again prior to procedure. Pt states they will.   Instructions sent by mail with coupon and by My Chart   Coupon sent via text to mobile phone and pt verified they received it

## 2023-10-04 ENCOUNTER — Encounter: Payer: Self-pay | Admitting: Gastroenterology

## 2023-10-07 ENCOUNTER — Encounter: Payer: Self-pay | Admitting: Internal Medicine

## 2023-10-07 ENCOUNTER — Telehealth (INDEPENDENT_AMBULATORY_CARE_PROVIDER_SITE_OTHER): Payer: Medicare Other | Admitting: Internal Medicine

## 2023-10-07 DIAGNOSIS — J22 Unspecified acute lower respiratory infection: Secondary | ICD-10-CM | POA: Diagnosis not present

## 2023-10-07 MED ORDER — AZITHROMYCIN 250 MG PO TABS: ORAL_TABLET | ORAL | 0 refills | Status: DC

## 2023-10-07 MED ORDER — HYDROCODONE BIT-HOMATROP MBR 5-1.5 MG/5ML PO SOLN: 5.0000 mL | Freq: Three times a day (TID) | ORAL | 0 refills | Status: DC | PRN

## 2023-10-07 NOTE — Telephone Encounter (Signed)
Patient contacted the answering service requesting to speak with me regarding respiratory infection symptoms onset on Thursday, January 30.  She took a home COVID test which was negative.  Has not taken her temperature.  Has had headache, sore throat, dry cough with mostly little to clear sputum production.  Patient is identified as Carla Cleveland. Collins, a patient in this practice.  She is at her home and I am at my home  She is agreeable to speaking with me in this format today.  This was a telephone visit only.  Unable to connect with video due to logistical issues.  Patient's general health is excellent.  She has a history of migraine headaches and hyperlipidemia.  Her medications have been reviewed.  Has not had COVID booster.  May not have had flu vaccine this season either.  Our records do not give a date for flu vaccine this season.  She is allergic to sulfa antibiotics.  Patient is able to give a clear concise history.  She sounds nasally congested when she speaks.  Not heard to have excessive coughing but says cough is an issue for her and she would like cough syrup sent in.  [place] PMP aware database checked prior to prescribing narcotic cough syrup.  Patient appears to have an acute upper respiratory infection due to a viral syndrome complicated by a cough with clear sputum production.  COVID test is negative.  Possibly could have influenza or other respiratory virus.  With shared decision making we have agreed that I will prescribe Zithromax Z-PAK to take 2 tabs day 1 followed by 1 tab days 2 through 5 and Hycodan 1 teaspoon every 8 hours as needed for cough.  She will rest at home and stay well-hydrated.  She will call me if not improving in 24 to 48 hours or sooner if worse.  She can be seen in the office this coming Monday if necessary or can seek emergency treatment prior to that if needed.   Time spent with this visit including speaking with answering service and collecting information,  telephone conversation with patient , history taking, chart review, medical decision making and E scribing medications is 20 minutes.

## 2023-10-11 ENCOUNTER — Ambulatory Visit: Payer: Medicare Other | Admitting: Gastroenterology

## 2023-10-11 ENCOUNTER — Encounter: Payer: Self-pay | Admitting: Gastroenterology

## 2023-10-11 VITALS — BP 136/97 | HR 70 | Temp 97.0°F | Resp 11 | Ht 66.0 in | Wt 190.0 lb

## 2023-10-11 DIAGNOSIS — D125 Benign neoplasm of sigmoid colon: Secondary | ICD-10-CM | POA: Diagnosis not present

## 2023-10-11 DIAGNOSIS — K642 Third degree hemorrhoids: Secondary | ICD-10-CM | POA: Diagnosis not present

## 2023-10-11 DIAGNOSIS — D122 Benign neoplasm of ascending colon: Secondary | ICD-10-CM | POA: Diagnosis not present

## 2023-10-11 DIAGNOSIS — K644 Residual hemorrhoidal skin tags: Secondary | ICD-10-CM | POA: Diagnosis not present

## 2023-10-11 DIAGNOSIS — K5730 Diverticulosis of large intestine without perforation or abscess without bleeding: Secondary | ICD-10-CM | POA: Diagnosis not present

## 2023-10-11 DIAGNOSIS — Z1211 Encounter for screening for malignant neoplasm of colon: Secondary | ICD-10-CM

## 2023-10-11 DIAGNOSIS — Z860100 Personal history of colon polyps, unspecified: Secondary | ICD-10-CM

## 2023-10-11 MED ORDER — SODIUM CHLORIDE 0.9 % IV SOLN: 500.0000 mL | Freq: Once | INTRAVENOUS | Status: DC

## 2023-10-11 NOTE — Progress Notes (Signed)
 Called to room to assist during endoscopic procedure.  Patient ID and intended procedure confirmed with present staff. Received instructions for my participation in the procedure from the performing physician.

## 2023-10-11 NOTE — Patient Instructions (Addendum)
 Thank you for letting us  take care of your healthcare needs today. Please see handouts given to you on Polyps, Diverticulosis and Hemorrhoids. Resume your previous medications.       YOU HAD AN ENDOSCOPIC PROCEDURE TODAY AT THE [facility]:   Refer to the procedure report that was given to you for any specific questions about what was found during the examination.  If the procedure report does not answer your questions, please call your gastroenterologist to clarify.  If you requested that your care partner not be given the details of your procedure findings, then the procedure report has been included in a sealed envelope for you to review at your convenience later.  YOU SHOULD EXPECT: Some feelings of bloating in the abdomen. Passage of more gas than usual.  Walking can help get rid of the air that was put into your GI tract during the procedure and reduce the bloating. If you had a lower endoscopy (such as a colonoscopy or flexible sigmoidoscopy) you may notice spotting of blood in your stool or on the toilet paper. If you underwent a bowel prep for your procedure, you may not have a normal bowel movement for a few days.  Please Note:  You might notice some irritation and congestion in your nose or some drainage.  This is from the oxygen used during your procedure.  There is no need for concern and it should clear up in a day or so.  SYMPTOMS TO REPORT IMMEDIATELY:  Following lower endoscopy (colonoscopy or flexible sigmoidoscopy):  Excessive amounts of blood in the stool  Significant tenderness or worsening of abdominal pains  Swelling of the abdomen that is new, acute  Fever of 100F or higher   For urgent or emergent issues, a gastroenterologist can be reached at any hour by calling [phone]. Do not use MyChart messaging for urgent concerns.    DIET:  We do recommend a small meal at first, but then you may proceed to your regular diet.  Drink plenty of fluids but  you should avoid alcoholic beverages for 24 hours.  ACTIVITY:  You should plan to take it easy for the rest of today and you should NOT DRIVE or use heavy machinery until tomorrow (because of the sedation medicines used during the test).    FOLLOW UP: Our staff will call the number listed on your records the next business day following your procedure.  We will call around 7:15- 8:00 am to check on you and address any questions or concerns that you may have regarding the information given to you following your procedure. If we do not reach you, we will leave a message.     If any biopsies were taken you will be contacted by phone or by letter within the next 1-3 weeks.  Please call us  at [phone] if you have not heard about the biopsies in 3 weeks.    SIGNATURES/CONFIDENTIALITY: You and/or your care partner have signed paperwork which will be entered into your electronic medical record.  These signatures attest to the fact that that the information above on your After Visit Summary has been reviewed and is understood.  Full responsibility of the confidentiality of this discharge information lies with you and/or your care-partner.

## 2023-10-11 NOTE — Op Note (Signed)
 [facility] Patient Name: Carla Collins Procedure Date: 10/11/2023 12:15 PM MRN: [id number] Endoscopist: Sandor Flatter , MD, [id number] Age: 73 Referring MD:  Date of Birth: 1951/07/08 Gender: Female Account #: [id number] Procedure:                Colonoscopy Indications:              Surveillance: Personal history of adenomatous                            polyps on last colonoscopy 5 years ago                           Last colonoscopy was 06/2018 and notable for 2                            small 2-4 mm sigmoid polyps (path: adenoma x1), 6                            mm sigmoid polyp (path: adenoma), internal                            hemorrhoids, and a localized area of mildly                            erythematous mucosa at the ICV (path: non-specific                            acute ileitis without chronic inflammatory                            changes), with otherwise normal TI. Medicines:                Monitored Anesthesia Care Procedure:                Pre-Anesthesia Assessment:                           - Prior to the procedure, a History and Physical                            was performed, and patient medications and                            allergies were reviewed. The patient's tolerance of                            previous anesthesia was also reviewed. The risks                            and benefits of the procedure and the sedation                            options and risks were discussed with the patient.  All questions were answered, and informed consent                            was obtained. Prior Anticoagulants: The patient has                            taken no anticoagulant or antiplatelet agents. ASA                            Grade Assessment: II - A patient with mild systemic                            disease. After reviewing the risks and benefits,                            the patient was deemed in  satisfactory condition to                            undergo the procedure.                           After obtaining informed consent, the colonoscope                            was passed under direct vision. Throughout the                            procedure, the patient's blood pressure, pulse, and                            oxygen saturations were monitored continuously. The                            Olympus CF-HQ190L ([id number]) Colonoscope was                            introduced through the anus and advanced to the the                            terminal ileum. The colonoscopy was performed                            without difficulty. The patient tolerated the                            procedure well. The quality of the bowel                            preparation was good. The terminal ileum, ileocecal                            valve, appendiceal orifice, and rectum were  photographed. Scope In: 1:37:08 PM Scope Out: 1:56:18 PM Scope Withdrawal Time: 0 hours 14 minutes 16 seconds  Total Procedure Duration: 0 hours 19 minutes 10 seconds  Findings:                 Hemorrhoids were found on perianal exam.                           Three sessile polyps were found in the ascending                            colon. The polyps were 3 to 4 mm in size. These                            polyps were removed with a cold snare. Resection                            and retrieval were complete. Estimated blood loss                            was minimal.                           A 3 mm polyp was found in the sigmoid colon. The                            polyp was sessile. The polyp was removed with a                            cold snare. Resection and retrieval were complete.                            Estimated blood loss was minimal.                           A few small-mouthed diverticula were found in the                            sigmoid colon.                            Non-bleeding external and internal hemorrhoids were                            found during retroflexion. The hemorrhoids were                            medium-sized and Grade III (internal hemorrhoids                            that prolapse but require manual reduction).                           The terminal ileum appeared normal. Complications:            No  immediate complications. Estimated Blood Loss:     Estimated blood loss was minimal. Impression:               - Hemorrhoids found on perianal exam.                           - Three 3 to 4 mm polyps in the ascending colon,                            removed with a cold snare. Resected and retrieved.                           - One 3 mm polyp in the sigmoid colon, removed with                            a cold snare. Resected and retrieved.                           - Diverticulosis in the sigmoid colon.                           - Non-bleeding external and internal hemorrhoids.                           - The examined portion of the ileum was normal. Recommendation:           - Patient has a contact number available for                            emergencies. The signs and symptoms of potential                            delayed complications were discussed with the                            patient. Return to normal activities tomorrow.                            Written discharge instructions were provided to the                            patient.                           - Resume previous diet.                           - Continue present medications.                           - Await pathology results.                           - Repeat colonoscopy for surveillance based on  pathology results.                           - Return to GI office PRN.                           - If planning on any intervention for the                            hemorrhoids, I recommend referral to the  Colorectal                            Surgery Clinic. Sandor Flatter, MD 10/11/2023 2:08:40 PM

## 2023-10-11 NOTE — Progress Notes (Signed)
 Pt's states no medical or surgical changes since previsit or office visit.

## 2023-10-11 NOTE — Progress Notes (Signed)
 Sedate, gd SR, tolerated procedure well, VSS, report to RN

## 2023-10-11 NOTE — Progress Notes (Signed)
 GASTROENTEROLOGY PROCEDURE H&P NOTE   Primary Care Physician: Perri Ronal PARAS, MD    Reason for Procedure:  Colon polyp surveillance  Plan:    Colonoscopy  Patient is appropriate for endoscopic procedure(s) in the ambulatory ([facility]) setting.  The nature of the procedure, as well as the risks, benefits, and alternatives were carefully and thoroughly reviewed with the patient. Ample time for discussion and questions allowed. The patient understood, was satisfied, and agreed to proceed.     HPI: Carla Collins is a 73 y.o. female who presents for colonoscopy for ongoing colon polyp surveillance and colon cancer screening.  No active GI symptoms.  No known family history of colon cancer or related malignancy.  Patient is otherwise without complaints or active issues today.  Last colonoscopy was 06/2018 and notable for 2 small 2-4 mm sigmoid polyps (path: adenoma x1), 6 mm sigmoid polyp (path: adenoma), internal hemorrhoids, and a localized area of mildly erythematous mucosa at the ICV (path: non-specific acute ileitis without chronic inflammatory changes), with otherwise normal TI.   Past Medical History:  Diagnosis Date   Cervical dysplasia    History of migraines    Hyperlipidemia    Urticaria    Vitamin D  deficiency     Past Surgical History:  Procedure Laterality Date   ABDOMINAL HYSTERECTOMY     COLONOSCOPY      Prior to Admission medications   Medication Sig Start Date End Date Taking? Authorizing Provider  azithromycin  (ZITHROMAX ) 250 MG tablet Take 2 tablets on day 1, then 1 tablet daily on days 2 through 5 10/07/23 10/12/23  Perri Ronal PARAS, MD  Cholecalciferol (VITAMIN D3) 25 MCG (1000 UT) CAPS Take by mouth.    [provider]  HYDROcodone  bit-homatropine (HYCODAN) 5-1.5 MG/5ML syrup Take 5 mLs by mouth every 8 (eight) hours as needed for cough. 10/07/23   Perri Ronal PARAS, MD  LUTEIN PO Take by mouth.    [provider]  Multiple Vitamin (MULTIVITAMIN)  capsule Take 1 capsule by mouth daily.    [provider]  PEG 3350-KCl-NaCl-NaSulf-MgSul (SUFLAVE ) 178.7 g SOLR Take 1 kit by mouth as directed. 09/25/23   Brynden Thune V, DO  rizatriptan  (MAXALT ) 10 MG tablet Take 1 tablet (10 mg total) by mouth as needed for migraine. May repeat in 2 hours if needed 02/10/20   Perri Ronal PARAS, MD  rosuvastatin  (CRESTOR ) 10 MG tablet TAKE 1 TABLET EVERY DAY AT SUPPER 11/28/22   Perri Ronal PARAS, MD    Current Outpatient Medications  Medication Sig Dispense Refill   azithromycin  (ZITHROMAX ) 250 MG tablet Take 2 tablets on day 1, then 1 tablet daily on days 2 through 5 6 tablet 0   Cholecalciferol (VITAMIN D3) 25 MCG (1000 UT) CAPS Take by mouth.     HYDROcodone  bit-homatropine (HYCODAN) 5-1.5 MG/5ML syrup Take 5 mLs by mouth every 8 (eight) hours as needed for cough. 120 mL 0   LUTEIN PO Take by mouth.     Multiple Vitamin (MULTIVITAMIN) capsule Take 1 capsule by mouth daily.     PEG 3350-KCl-NaCl-NaSulf-MgSul (SUFLAVE ) 178.7 g SOLR Take 1 kit by mouth as directed. 1 each 0   rizatriptan  (MAXALT ) 10 MG tablet Take 1 tablet (10 mg total) by mouth as needed for migraine. May repeat in 2 hours if needed 10 tablet 1   rosuvastatin  (CRESTOR ) 10 MG tablet TAKE 1 TABLET EVERY DAY AT SUPPER 90 tablet 3   No current facility-administered medications for this visit.  Allergies as of 10/11/2023 - Review Complete 10/11/2023  Allergen Reaction Noted   Sulfa antibiotics Rash 03/16/2011    Family History  Problem Relation Age of Onset   Cancer Father    Colon cancer Neg Hx    Esophageal cancer Neg Hx    Rectal cancer Neg Hx    Stomach cancer Neg Hx    Colon polyps Neg Hx     Social History   Socioeconomic History   Marital status: Married    Spouse name: Not on file   Number of children: Not on file   Years of education: Not on file   Highest education level: Not on file  Occupational History   Not on file  Tobacco Use   Smoking status: Never    Smokeless tobacco: Never  Substance and Sexual Activity   Alcohol use: No   Drug use: No   Sexual activity: Not on file  Other Topics Concern   Not on file  Social History Narrative   Right handed   Social Drivers of Health   Financial Resource Strain: Low Risk  (06/30/2023)   Overall Financial Resource Strain (CARDIA)    Difficulty of Paying Living Expenses: Not very hard  Food Insecurity: No Food Insecurity (06/30/2023)   Hunger Vital Sign    Worried About Running Out of Food in the Last Year: Never true    Ran Out of Food in the Last Year: Never true  Transportation Needs: No Transportation Needs (06/30/2023)   PRAPARE - Administrator, Civil Service (Medical): No    Lack of Transportation (Non-Medical): No  Physical Activity: Insufficiently Active (06/30/2023)   Exercise Vital Sign    Days of Exercise per Week: 1 day    Minutes of Exercise per Session: 30 min  Stress: No Stress Concern Present (06/30/2023)   [employer] of Occupational Health - Occupational Stress Questionnaire    Feeling of Stress : Not at all  Social Connections: Socially Integrated (06/30/2023)   Social Connection and Isolation Panel [NHANES]    Frequency of Communication with Friends and Family: More than three times a week    Frequency of Social Gatherings with Friends and Family: More than three times a week    Attends Religious Services: More than 4 times per year    Active Member of [employer] or Organizations: Yes    Attends Engineer, Structural: More than 4 times per year    Marital Status: Married  Catering Manager Violence: Not At Risk (06/30/2023)   Humiliation, Afraid, Rape, and Kick questionnaire    Fear of Current or Ex-Partner: No    Emotionally Abused: No    Physically Abused: No    Sexually Abused: No    Physical Exam: Vital signs in last 24 hours: @There  were no vitals taken for this visit. GEN: NAD EYE: Sclerae anicteric ENT: MMM CV:  Non-tachycardic Pulm: CTA b/l GI: Soft, NT/ND NEURO:  Alert & Oriented x 3   Sandor Flatter, DO [facility]   10/11/2023 12:23 PM

## 2023-10-12 ENCOUNTER — Telehealth: Payer: Self-pay

## 2023-10-12 NOTE — Telephone Encounter (Signed)
 LMOM

## 2023-10-16 LAB — SURGICAL PATHOLOGY

## 2023-10-24 ENCOUNTER — Encounter: Payer: Self-pay | Admitting: Gastroenterology

## 2023-11-30 ENCOUNTER — Other Ambulatory Visit: Payer: Self-pay | Admitting: Internal Medicine

## 2024-01-10 DIAGNOSIS — K080 Exfoliation of teeth due to systemic causes: Secondary | ICD-10-CM | POA: Diagnosis not present

## 2024-01-23 DIAGNOSIS — L821 Other seborrheic keratosis: Secondary | ICD-10-CM | POA: Diagnosis not present

## 2024-01-23 DIAGNOSIS — L728 Other follicular cysts of the skin and subcutaneous tissue: Secondary | ICD-10-CM | POA: Diagnosis not present

## 2024-02-01 ENCOUNTER — Ambulatory Visit: Payer: Self-pay

## 2024-02-01 NOTE — Telephone Encounter (Signed)
 Copied from CRM [id number]. Topic: Clinical - Red Word Triage >> Feb 01, 2024 11:55 AM Stanly Early wrote: rosuvastatin  (CRESTOR ) 10 MG tablet is causing aches in the both legs.    Chief Complaint: Pt. Believes Crestor  is causing muscle aches to legs.  Symptoms: above Frequency: 1 month Pertinent Negatives: Patient denies  Disposition: [] ED /[] Urgent Care (no appt availability in office) / [x] Appointment(In office/virtual)/ []  [facility]/ [] Home Care/ [] Refused Recommended Disposition /[]  Mobile Bus/ []  Follow-up with PCP Additional Notes: agrees with appointment.  Reason for Disposition  [1] Caller has URGENT medicine question about med that PCP or specialist prescribed AND [2] triager unable to answer question  Answer Assessment - Initial Assessment Questions 1. NAME of MEDICINE: "What medicine(s) are you calling about?"     Crestor  2. QUESTION: "What is your question?" (e.g., double dose of medicine, side effect)     Aches in legs 3. PRESCRIBER: "Who prescribed the medicine?" Reason: if prescribed by specialist, call should be referred to that group.     PCP 4. SYMPTOMS: "Do you have any symptoms?" If Yes, ask: "What symptoms are you having?"  "How bad are the symptoms (e.g., mild, moderate, severe)     Aches in legs 5. PREGNANCY:  "Is there any chance that you are pregnant?" "When was your last menstrual period?"     no  Protocols used: Medication Question Call-A-AH

## 2024-02-02 ENCOUNTER — Ambulatory Visit (INDEPENDENT_AMBULATORY_CARE_PROVIDER_SITE_OTHER): Admitting: Internal Medicine

## 2024-02-02 ENCOUNTER — Encounter: Payer: Self-pay | Admitting: Internal Medicine

## 2024-02-02 VITALS — BP 100/80 | HR 76 | Ht 66.0 in | Wt 196.0 lb

## 2024-02-02 DIAGNOSIS — G4762 Sleep related leg cramps: Secondary | ICD-10-CM | POA: Diagnosis not present

## 2024-02-02 NOTE — Patient Instructions (Addendum)
 Suggest trying Coenzyme Q over the counter for muscle aches associated with statin medication. If symptoms do not improve within a few weeks will refer to Dr. Candida Chalk lipid clinic.

## 2024-02-02 NOTE — Progress Notes (Signed)
 Patient Care Team: Sylvan Evener, MD as PCP - General (Internal Medicine)  Visit Date: 02/02/24  Subjective:   Chief Complaint  Patient presents with   Medication Problem    Leg aching at night, she thinks it's related to crestor .   Patient WU:JWJXBJY M Ezelle,Female DOB:[birth year],73 y.o. NWG:[id number]   73 y.o.Female presents today for acute visit with Myalgias. Patient has a past medical history of Hyperlipidemia; Simvastatin  Intolerance w/ Myalgias. She says that when she started Crestor  back in 2016 after having myalgias initially with Simvastatin  she had no issues, however recently her legs have started aching again and waking her up while asleep so she had stopped her Crestor  2 weeks ago to see if that may have been contributing and her myalgias stopped; she restarted Crestor  again on Sunday and so did the myalgias.   Past Medical History:  Diagnosis Date   Cervical dysplasia    History of migraines    Hyperlipidemia    Urticaria    Vitamin D  deficiency     Allergies  Allergen Reactions   Sulfa Antibiotics Rash    Family History  Problem Relation Age of Onset   Cancer Father    Colon cancer Neg Hx    Esophageal cancer Neg Hx    Rectal cancer Neg Hx    Stomach cancer Neg Hx    Colon polyps Neg Hx    Social Hx: Married. 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 Does not smoke or consume alcohol. One daughter and one grandchild.  Review of Systems  Musculoskeletal:  Positive for myalgias (legs, aching, wakings her up at night).     Objective:  Vitals: BP 100/80   Pulse 76   Ht 5\' 6"  (1.676 m)   Wt 196 lb (88.9 kg)   SpO2 93%   BMI 31.64 kg/m   Physical Exam Vitals and nursing note reviewed.  Constitutional:      General: She is not in acute distress.    Appearance: Normal appearance. She is not toxic-appearing.  HENT:     Head: Normocephalic and atraumatic.  Pulmonary:     Effort: Pulmonary effort is normal.  Skin:    General: Skin is  warm and dry.  Neurological:     Mental Status: She is alert and oriented to person, place, and time. Mental status is at baseline.  Psychiatric:        Mood and Affect: Mood normal.        Behavior: Behavior normal.        Thought Content: Thought content normal.        Judgment: Judgment normal.     Results:  Studies Obtained And Personally Reviewed By Me: Labs:     Component Value Date/Time   NA 142 06/20/2023 1055   K 4.7 06/20/2023 1055   CL 108 06/20/2023 1055   CO2 25 06/20/2023 1055   GLUCOSE 98 06/20/2023 1055   BUN 18 06/20/2023 1055   CREATININE 0.96 06/20/2023 1055   CALCIUM  9.5 06/20/2023 1055   PROT 7.3 06/20/2023 1055   ALBUMIN 3.7 10/25/2016 1029   AST 22 06/20/2023 1055   ALT 17 06/20/2023 1055   ALKPHOS 105 10/25/2016 1029   BILITOT 0.5 06/20/2023 1055   GFRNONAA 64 05/14/2020 1127   GFRAA 74 05/14/2020 1127    Lab Results  Component Value Date   WBC 5.5 06/20/2023   HGB 15.0 06/20/2023   HCT 46.0 (H) 06/20/2023   MCV 92.4 06/20/2023  PLT 277 06/20/2023   Lab Results  Component Value Date   CHOL 173 06/20/2023   HDL 59 06/20/2023   LDLCALC 95 06/20/2023   TRIG 94 06/20/2023   CHOLHDL 2.9 06/20/2023   Lab Results  Component Value Date   HGBA1C 5.9 (H) 12/30/2013    Lab Results  Component Value Date   TSH 2.41 06/20/2023    Assessment & Plan:   Myalgias, Legs: has a medical history of Hyperlipidemia; Simvastatin  Intolerance w/ Myalgias. She started Crestor  back in 2016 after having myalgias initially with Simvastatin  and had no issues, however recently her legs have started aching again, waking her up so had stopped her Crestor  2 weeks ago to see if that may have been contributing and myalgias resolved. After restarting Crestor  again on Sunday the myalgias returned. Recommended trying  Coenzyme Q10 for  relief of myalgias and if myalgias do not resolve will consider referral to Lipid Clinic for medication management of hyperlipidemia.      I,Emily Lagle,acting as a Neurosurgeon for Sylvan Evener, MD.,have documented all relevant documentation on the behalf of Sylvan Evener, MD,as directed by  Sylvan Evener, MD while in the presence of Sylvan Evener, MD.   I, Sylvan Evener, MD, have reviewed all documentation for this visit. The documentation on 02/02/24 for the exam, diagnosis, procedures, and orders are all accurate and complete.

## 2024-03-21 DIAGNOSIS — K080 Exfoliation of teeth due to systemic causes: Secondary | ICD-10-CM | POA: Diagnosis not present

## 2024-04-12 ENCOUNTER — Other Ambulatory Visit: Payer: Medicare Other

## 2024-05-16 ENCOUNTER — Encounter: Payer: Self-pay | Admitting: Internal Medicine

## 2024-05-16 ENCOUNTER — Ambulatory Visit (INDEPENDENT_AMBULATORY_CARE_PROVIDER_SITE_OTHER): Admitting: Internal Medicine

## 2024-05-16 VITALS — BP 120/88 | HR 88 | Temp 97.9°F | Ht 66.0 in | Wt 196.0 lb

## 2024-05-16 DIAGNOSIS — J22 Unspecified acute lower respiratory infection: Secondary | ICD-10-CM

## 2024-05-16 DIAGNOSIS — H6502 Acute serous otitis media, left ear: Secondary | ICD-10-CM | POA: Diagnosis not present

## 2024-05-16 MED ORDER — FLUCONAZOLE 150 MG PO TABS: 150.0000 mg | ORAL_TABLET | Freq: Once | ORAL | 0 refills | Status: AC

## 2024-05-16 MED ORDER — AZITHROMYCIN 250 MG PO TABS: ORAL_TABLET | ORAL | 0 refills | Status: AC

## 2024-05-16 MED ORDER — HYDROCODONE BIT-HOMATROP MBR 5-1.5 MG/5ML PO SOLN: 5.0000 mL | Freq: Three times a day (TID) | ORAL | 0 refills | Status: AC | PRN

## 2024-05-16 MED ORDER — COVID-19 MRNA VACC (MODERNA) 50 MCG/0.5ML IM SUSP: 0.5000 mL | Freq: Once | INTRAMUSCULAR | 0 refills | Status: AC

## 2024-05-16 NOTE — Progress Notes (Signed)
 Patient Care Team: Perri Ronal PARAS, MD as PCP - General (Internal Medicine)  Visit Date: 05/16/24  Subjective:    Patient ID: Carla Collins , Female   DOB: Oct 05, 1950, 73 y.o.    MRN: [id number]   73 y.o. Female presents today for sick visit for cough, congestion and ear pain. Patient has a past medical history of cervical dysplasia, migraine, headaches, urticaria, vitamin d  deficiency.   She said she has been sick since Sunday. She was around her granddaughter who had also been sick recently. She has a non productive cough, Congestion and ear pain. She had a sore throat but that has since subsided.Physical exam showed fluid in left ear. Covid test was negative. she was prescribed Hycodan 5 mL by mouth as needed for cough, Diflucan  150 mg once and azithromycin  250 mg 2 tablets on the first day 1 tablet on days 2-5.     History of Vitamin D  deficiency treated with Vitamin D3 1000 units daily    History of Hyperlipidemia treated with Rosuvastatin  10 mg daily   History of Migraine Headaches treated with Maxalt  10 mg as needed   Vaccine counseling: Agreeable to receiving Covid-19 Vaccine.  Past Medical History:  Diagnosis Date   Cervical dysplasia    History of migraines    Hyperlipidemia    Urticaria    Vitamin D  deficiency      Family History  Problem Relation Age of Onset   Cancer Father    Colon cancer Neg Hx    Esophageal cancer Neg Hx    Rectal cancer Neg Hx    Stomach cancer Neg Hx    Colon polyps Neg Hx     Social History   Social History Narrative   Right handed      Review of Systems  HENT:  Positive for congestion and ear pain.   Respiratory:  Positive for cough.         Objective:   Vitals: There were no vitals taken for this visit.   Physical Exam HENT:     Ears:     Comments: Left TM: Pink and full with a splayed light reflex    Right TM: Pink but not full    Mouth/Throat:     Comments: Pharynx slightly injected  Pulmonary:     Breath  sounds: Normal breath sounds.  Musculoskeletal:     Cervical back: Neck supple.       Results:   Studies obtained and personally reviewed by me:     Labs:       Component Value Date/Time   NA 142 06/20/2023 1055   K 4.7 06/20/2023 1055   CL 108 06/20/2023 1055   CO2 25 06/20/2023 1055   GLUCOSE 98 06/20/2023 1055   BUN 18 06/20/2023 1055   CREATININE 0.96 06/20/2023 1055   CALCIUM  9.5 06/20/2023 1055   PROT 7.3 06/20/2023 1055   ALBUMIN 3.7 10/25/2016 1029   AST 22 06/20/2023 1055   ALT 17 06/20/2023 1055   ALKPHOS 105 10/25/2016 1029   BILITOT 0.5 06/20/2023 1055   GFRNONAA 64 05/14/2020 1127   GFRAA 74 05/14/2020 1127     Lab Results  Component Value Date   WBC 5.5 06/20/2023   HGB 15.0 06/20/2023   HCT 46.0 (H) 06/20/2023   MCV 92.4 06/20/2023   PLT 277 06/20/2023    Lab Results  Component Value Date   CHOL 173 06/20/2023   HDL 59 06/20/2023   LDLCALC 95  06/20/2023   TRIG 94 06/20/2023   CHOLHDL 2.9 06/20/2023    Lab Results  Component Value Date   HGBA1C 5.9 (H) 12/30/2013     Lab Results  Component Value Date   TSH 2.41 06/20/2023         Assessment & Plan:  Acute lower respiratory infection Cough: She said she has been sick since Sunday. She was around her granddaughter who had also been sick recently. She has a non productive cough, Congestion and ear pain. She had a sore throat but that has since subsided. Physical exam showed fluid in left ear c/w left serous otitis media Covid test was negative.  she was prescribed Hycodan 5 mL by mouth as needed for cough, Diflucan  150 mg once and azithromycin  250 mg 2 tablets on the first day 1 tablet on days 2-5.    Vaccine counseling: Agreeable to receiving Covid-19 Vaccine.   Call if not better in 7-10 days or sooner if worse  I,Makayla C Reid,acting as a scribe for Ronal JINNY Hailstone, MD.,have documented all relevant documentation on the behalf of Ronal JINNY Hailstone, MD,as directed by  Ronal JINNY Hailstone, MD  while in the presence of Ronal JINNY Hailstone, MD.

## 2024-05-26 NOTE — Patient Instructions (Signed)
 You have an acute lower respiratory infection along with left serous otitis media. Please take Zithomax Z pak 2 tabs day 1 followed by one tab days 2-5. May  take Hycodan sparingly for cough. Rest and stay well hydrated. Call if not better in 7 to 10 days or sooner if worse.

## 2024-06-20 DIAGNOSIS — Z1231 Encounter for screening mammogram for malignant neoplasm of breast: Secondary | ICD-10-CM | POA: Diagnosis not present

## 2024-06-20 LAB — HM MAMMOGRAPHY

## 2024-06-24 ENCOUNTER — Encounter: Payer: Self-pay | Admitting: Internal Medicine

## 2024-06-25 ENCOUNTER — Other Ambulatory Visit: Payer: Medicare Other

## 2024-06-25 DIAGNOSIS — Z0000 Encounter for general adult medical examination without abnormal findings: Secondary | ICD-10-CM

## 2024-06-25 DIAGNOSIS — Z13220 Encounter for screening for lipoid disorders: Secondary | ICD-10-CM

## 2024-06-25 DIAGNOSIS — E7800 Pure hypercholesterolemia, unspecified: Secondary | ICD-10-CM | POA: Diagnosis not present

## 2024-06-25 DIAGNOSIS — Z780 Asymptomatic menopausal state: Secondary | ICD-10-CM | POA: Diagnosis not present

## 2024-06-25 LAB — CBC WITH DIFFERENTIAL/PLATELET
Absolute Lymphocytes: 3359 {cells}/uL (ref 850–3900)
Absolute Monocytes: 389 {cells}/uL (ref 200–950)
Basophils Absolute: 59 {cells}/uL (ref 0–200)
Basophils Relative: 0.9 %
Eosinophils Absolute: 53 {cells}/uL (ref 15–500)
Eosinophils Relative: 0.8 %
HCT: 47.3 % — ABNORMAL HIGH (ref 35.0–45.0)
Hemoglobin: 15.3 g/dL (ref 11.7–15.5)
MCH: 30.1 pg (ref 27.0–33.0)
MCHC: 32.3 g/dL (ref 32.0–36.0)
MCV: 92.9 fL (ref 80.0–100.0)
MPV: 11.7 fL (ref 7.5–12.5)
Monocytes Relative: 5.9 %
Neutro Abs: 2739 {cells}/uL (ref 1500–7800)
Neutrophils Relative %: 41.5 %
Platelets: 259 Thousand/uL (ref 140–400)
RBC: 5.09 Million/uL (ref 3.80–5.10)
RDW: 13.7 % (ref 11.0–15.0)
Total Lymphocyte: 50.9 %
WBC: 6.6 Thousand/uL (ref 3.8–10.8)

## 2024-06-25 LAB — LIPID PANEL
Cholesterol: 165 mg/dL (ref <200)
HDL: 65 mg/dL (ref >=50)
LDL Cholesterol (Calc): 80 mg/dL
Non-HDL Cholesterol (Calc): 100 mg/dL (ref <130)
Total CHOL/HDL Ratio: 2.5 (calc) (ref <5.0)
Triglycerides: 104 mg/dL (ref <150)

## 2024-06-25 LAB — TSH: TSH: 2.77 m[IU]/L (ref 0.40–4.50)

## 2024-06-25 LAB — COMPREHENSIVE METABOLIC PANEL WITH GFR
AG Ratio: 1.3 (calc) (ref 1.0–2.5)
ALT: 13 U/L (ref 6–29)
AST: 20 U/L (ref 10–35)
Albumin: 4.3 g/dL (ref 3.6–5.1)
Alkaline phosphatase (APISO): 97 U/L (ref 37–153)
BUN: 16 mg/dL (ref 7–25)
CO2: 26 mmol/L (ref 20–32)
Calcium: 9.6 mg/dL (ref 8.6–10.4)
Chloride: 106 mmol/L (ref 98–110)
Creat: 0.86 mg/dL (ref 0.60–1.00)
Globulin: 3.3 g/dL (ref 1.9–3.7)
Glucose, Bld: 88 mg/dL (ref 65–99)
Potassium: 4.7 mmol/L (ref 3.5–5.3)
Sodium: 141 mmol/L (ref 135–146)
Total Bilirubin: 0.8 mg/dL (ref 0.2–1.2)
Total Protein: 7.6 g/dL (ref 6.1–8.1)
eGFR: 72 mL/min/1.73m2 (ref >=60)

## 2024-06-25 NOTE — Progress Notes (Signed)
 Lab only

## 2024-06-26 ENCOUNTER — Ambulatory Visit (HOSPITAL_BASED_OUTPATIENT_CLINIC_OR_DEPARTMENT_OTHER)
Admission: RE | Admit: 2024-06-26 | Discharge: 2024-06-26 | Disposition: A | Discharge status: Home / Self Care | Source: Ambulatory Visit | Attending: Internal Medicine | Admitting: Internal Medicine

## 2024-06-26 ENCOUNTER — Ambulatory Visit: Payer: Self-pay | Admitting: Internal Medicine

## 2024-06-26 DIAGNOSIS — M85851 Other specified disorders of bone density and structure, right thigh: Secondary | ICD-10-CM | POA: Diagnosis not present

## 2024-06-26 DIAGNOSIS — Z780 Asymptomatic menopausal state: Secondary | ICD-10-CM | POA: Diagnosis not present

## 2024-06-28 DIAGNOSIS — R928 Other abnormal and inconclusive findings on diagnostic imaging of breast: Secondary | ICD-10-CM | POA: Diagnosis not present

## 2024-06-28 DIAGNOSIS — N6489 Other specified disorders of breast: Secondary | ICD-10-CM | POA: Diagnosis not present

## 2024-06-28 NOTE — Progress Notes (Addendum)
 Annual Wellness Visit   Patient Care Team: Perri Ronal PARAS, MD as PCP - General (Internal Medicine)  Visit Date: 07/02/24   Chief Complaint  Patient presents with   Annual Exam    Patient is haing breast biopsy of the left today at 2:30pm.    Medicare Wellness   Subjective:  Patient: Carla Collins, Female DOB: 29-Nov-1950, 73 y.o. MRN: [id number] Vitals:   07/02/24 1052  BP: 120/80   Carla Collins is a 73 y.o. Female who presents today for her Annual Wellness Visit. Patient has Hyperlipidemia; Vitamin D  deficiency; History of frequent urinary tract infections; History of migraine headaches; SUI (stress urinary incontinence), female; Urge urinary incontinence; Statin intolerance; Family history of breast cancer in first degree relative; and Family history of first degree relative with dementia on their problem list.   She says that she is doing well.    She is undergoing a left breast biopsy today.   She said that her right eye has been red and itchy since Saturday. She was taking left over prescription eye drops that she received a year ago.   History of Vitamin D  deficiency treated with Vitamin D3 1000 units daily.     History of Hyperlipidemia treated with Rosuvastatin  10 mg daily.   History of Migraine Headaches treated with Maxalt  10 mg as needed.   History of stress and urge urinary incontinence.   Labs 06/25/2024 HCT 47.3, WNL.    Mammogram done 06/20/2024.   06/26/2024 Bone density right femoral neck BMD 0.875 T-score -1.2.   10/11/2023 Colonoscopy Hemorrhoids found on perianal exam. Three 3 to 4 mm polyps in the ascending colon. Resected and retrieved. One 3 mm polyp in the sigmoid colon. Resected and retrieved. Pathology found to be tubular adenoma. Diverticulosis in the sigmoid colon.  Non- bleeding external and internal hemorrhoids. The examined portion of the ileum was normal.  Health Maintenance  Topic Date Due   Hepatitis C Screening  Never done    COVID-19 Vaccine (7 - 2025-26 season) 05/06/2024   Medicare Annual Wellness (AWV)  06/29/2024   DTaP/Tdap/Td (3 - Td or Tdap) 04/16/2025   Mammogram  06/20/2025   Colonoscopy  10/10/2028   Pneumococcal Vaccine: 50+ Years  Completed   Influenza Vaccine  Completed   DEXA SCAN  Completed   Zoster Vaccines- Shingrix  Completed   Meningococcal B Vaccine  Aged Out    Review of Systems  Constitutional:  Negative for fever and malaise/fatigue.  HENT:  Negative for congestion.   Eyes:  Positive for pain. Negative for blurred vision.  Respiratory:  Negative for cough and shortness of breath.   Cardiovascular:  Negative for chest pain, palpitations and leg swelling.  Gastrointestinal:  Negative for vomiting.  Musculoskeletal:  Negative for back pain.  Skin:  Negative for rash.  Neurological:  Negative for loss of consciousness and headaches.   Objective:  Vitals: body mass index is 31.47 kg/m. Today's Vitals   07/02/24 1052  BP: 120/80  Pulse: 88  SpO2: 98%  Weight: 195 lb (88.5 kg)  Height: 5' 6 (1.676 m)   Physical Exam Vitals and nursing note reviewed.  Constitutional:      General: She is not in acute distress.    Appearance: Normal appearance. She is not ill-appearing or toxic-appearing.  HENT:     Head: Normocephalic and atraumatic.     Right Ear: Hearing, tympanic membrane, ear canal and external ear normal.     Left Ear: Hearing, tympanic  membrane, ear canal and external ear normal.     Mouth/Throat:     Pharynx: Oropharynx is clear.  Eyes:     Extraocular Movements: Extraocular movements intact.     Pupils: Pupils are equal, round, and reactive to light.     Comments: Conjunctivitis of right eye   Neck:     Thyroid : No thyroid  mass, thyromegaly or thyroid  tenderness.     Vascular: No carotid bruit.  Cardiovascular:     Rate and Rhythm: Normal rate and regular rhythm. No extrasystoles are present.    Pulses:          Dorsalis pedis pulses are 2+ on the right side  and 2+ on the left side.     Heart sounds: Normal heart sounds. No murmur heard.    No friction rub. No gallop.  Pulmonary:     Effort: Pulmonary effort is normal.     Breath sounds: Normal breath sounds. No decreased breath sounds, wheezing, rhonchi or rales.  Chest:     Chest wall: No mass.  Abdominal:     Palpations: Abdomen is soft. There is no hepatomegaly, splenomegaly or mass.     Tenderness: There is no abdominal tenderness.     Hernia: No hernia is present.  Musculoskeletal:     Cervical back: Normal range of motion.     Right lower leg: No edema.     Left lower leg: No edema.  Lymphadenopathy:     Cervical: No cervical adenopathy.     Upper Body:     Right upper body: No supraclavicular adenopathy.     Left upper body: No supraclavicular adenopathy.  Skin:    General: Skin is warm and dry.  Neurological:     General: No focal deficit present.     Mental Status: She is alert and oriented to person, place, and time. Mental status is at baseline.     Sensory: Sensation is intact.     Motor: Motor function is intact. No weakness.     Deep Tendon Reflexes: Reflexes are normal and symmetric.  Psychiatric:        Attention and Perception: Attention normal.        Mood and Affect: Mood normal.        Speech: Speech normal.        Behavior: Behavior normal.        Thought Content: Thought content normal.        Cognition and Memory: Cognition normal.        Judgment: Judgment normal.     Current Outpatient Medications  Medication Instructions   Cholecalciferol (VITAMIN D3) 25 MCG (1000 UT) CAPS Take by mouth.   HYDROcodone  bit-homatropine (HYCODAN) 5-1.5 MG/5ML syrup 5 mLs, Oral, Every 8 hours PRN   Multiple Vitamin (MULTIVITAMIN) capsule 1 capsule, Daily   ofloxacin  (OCUFLOX ) 0.3 % ophthalmic solution 2 drops in right eye 4 times a day for 5-7 days   rizatriptan  (MAXALT ) 10 mg, Oral, As needed, May repeat in 2 hours if needed   rosuvastatin  (CRESTOR ) 10 MG tablet TAKE  1 TABLET EVERY DAY AT SUPPER   Past Medical History:  Diagnosis Date   Cervical dysplasia    History of migraines    Hyperlipidemia    Urticaria    Vitamin D  deficiency    Medical/Surgical History Narrative:  Allergic/Intolerant to:  Allergies  Allergen Reactions   Sulfa Antibiotics Rash    Past Surgical History:  Procedure Laterality Date   ABDOMINAL HYSTERECTOMY  COLONOSCOPY     Family History  Problem Relation Age of Onset   Cancer Father    Colon cancer Neg Hx    Esophageal cancer Neg Hx    Rectal cancer Neg Hx    Stomach cancer Neg Hx    Colon polyps Neg Hx     Social History   Social History Narrative   Right handed  Married.Has a Masters degree. Retired from [employer]. Does not smoke or consume alcohol. One daughter and one grandchild.  Most Recent Health Risks Assessment:   Medicare Risk at Home - 07/02/24 1044     Any stairs in or around the home? Yes    If so, are there any without handrails? Yes    Home free of loose throw rugs in walkways, pet beds, electrical cords, etc? Yes    Adequate lighting in your home to reduce risk of falls? Yes    Life alert? No    Use of a cane, walker or w/c? No    Grab bars in the bathroom? No    Shower chair or bench in shower? No    Elevated toilet seat or a handicapped toilet? No         Most Recent Social Determinants of Health (Including Hx of Tobacco, Alcohol, and Drug Use) SDOH Screenings   Food Insecurity: No Food Insecurity (07/02/2024)  Housing: Low Risk  (07/02/2024)  Transportation Needs: No Transportation Needs (07/02/2024)  Utilities: Not At Risk (06/30/2023)  Alcohol Screen: Low Risk  (07/02/2024)  Depression (PHQ2-9): Low Risk  (09/18/2023)  Financial Resource Strain: Low Risk  (07/02/2024)  Physical Activity: Insufficiently Active (07/02/2024)  Social Connections: Socially Integrated (07/02/2024)  Stress: No Stress Concern Present (07/02/2024)  Tobacco Use: Low Risk  (07/02/2024)  Health  Literacy: Adequate Health Literacy (06/30/2023)   Social History   Tobacco Use   Smoking status: Never   Smokeless tobacco: Never  Vaping Use   Vaping status: Never Used  Substance Use Topics   Alcohol use: No   Drug use: No   Most Recent Functional Status Assessment:    07/02/2024   10:44 AM  In your present state of health, do you have any difficulty performing the following activities:  Hearing? 0  Vision? 0  Difficulty concentrating or making decisions? 0  Walking or climbing stairs? 0  Dressing or bathing? 0  Doing errands, shopping? 0  Preparing Food and eating ? N  Using the Toilet? N  In the past six months, have you accidently leaked urine? N  Do you have problems with loss of bowel control? N  Managing your Medications? N  Managing your Finances? N  Housekeeping or managing your Housekeeping? N   Most Recent Fall Risk Assessment:    07/02/2024   10:45 AM  Fall Risk   Falls in the past year? 0  Number falls in past yr: 0  Injury with Fall? 0  Risk for fall due to : No Fall Risks  Follow up Education provided;Falls evaluation completed;Falls prevention discussed   Most Recent Anxiety/Depression Screenings:    09/18/2023    4:24 PM 06/21/2022    2:03 PM  PHQ 2/9 Scores  PHQ - 2 Score 0 0    Most Recent Cognitive Screening:    07/02/2024   10:52 AM  6CIT Screen  What Year? 0 points  What month? 0 points  What time? 0 points  Count back from 20 0 points  Months in reverse 0 points  Repeat  phrase 0 points  Total Score 0 points   Most Recent Vision/Hearing Screenings:No results found. Results:  Studies Obtained And Personally Reviewed By Me:  Mammogram done 06/20/2024.   06/26/2024 Bone density right femoral neck BMD 0.875 T-score -1.2.   10/11/2023 Colonoscopy Hemorrhoids found on perianal exam. - Three 3 to 4 mm polyps in the ascending colon. Resected and retrieved. One 3 mm polyp in the sigmoid colon. Resected and retrieved. Pathology  found to be tubular adenoma. Diverticulosis in the sigmoid colon.  Non- bleeding external and internal hemorrhoids. The examined portion of the ileum was normal.   Labs:  CBC w/ Differential Lab Results  Component Value Date   WBC 6.6 06/25/2024   RBC 5.09 06/25/2024   HGB 15.3 06/25/2024   HCT 47.3 (H) 06/25/2024   PLT 259 06/25/2024   MCV 92.9 06/25/2024   MCH 30.1 06/25/2024   MCHC 32.3 06/25/2024   RDW 13.7 06/25/2024   MPV 11.7 06/25/2024   LYMPHSABS 3,007 06/16/2022   MONOABS 774 08/15/2016   BASOSABS 59 06/25/2024    Comprehensive Metabolic Panel Lab Results  Component Value Date   NA 141 06/25/2024   K 4.7 06/25/2024   CL 106 06/25/2024   CO2 26 06/25/2024   GLUCOSE 88 06/25/2024   BUN 16 06/25/2024   CREATININE 0.86 06/25/2024   CALCIUM  9.6 06/25/2024   PROT 7.6 06/25/2024   ALBUMIN 3.7 10/25/2016   AST 20 06/25/2024   ALT 13 06/25/2024   ALKPHOS 105 10/25/2016   BILITOT 0.8 06/25/2024   EGFR 72 06/25/2024   GFRNONAA 64 05/14/2020   Lipid Panel  Lab Results  Component Value Date   CHOL 165 06/25/2024   HDL 65 06/25/2024   LDLCALC 80 06/25/2024   TRIG 104 06/25/2024   A1c Lab Results  Component Value Date   HGBA1C 5.9 (H) 12/30/2013    TSH Lab Results  Component Value Date   TSH 2.77 06/25/2024    Assessment & Plan:  No orders of the defined types were placed in this encounter.  Meds ordered this encounter  Medications   ofloxacin  (OCUFLOX ) 0.3 % ophthalmic solution    Sig: 2 drops in right eye 4 times a day for 5-7 days    Dispense:  5 mL    Refill:  0   She is undergoing a left breast biopsy today. Recently had abnormal finding left breast on routine mammogram  Conjunctivitis of right eye: She said that her right eye has been red and itchy since Saturday. She was taking left over prescription eye drops that she received a year ago.  Advised to discard outdated eye drops.    New Rx: Ocuflox  ophthalmic solution 2 drops into right eye 4  times a day for 5-7 days prescribed. May use warm compresses over right eye for 20 minutes 2-3 times daily.  Vitamin D  deficiency: treated with Vitamin D3 1000 units daily.  Votamin D level not checked due to expense.   Hyperlipidemia: treated with Rosuvastatin  10 mg daily.Lipid panel is normal.   Migraine Headaches: treated with Maxalt  10 mg tablets as needed.   History of stress and urge urinary incontinence.   Mammogram done 06/20/2024. Abnormal focal asymmetry left breast at 12 o'clock  06/26/2024 Bone density right femoral neck BMD 0.875 T-score -1.2.   Consider Covid vaccine update  10/11/2023 Colonoscopy Hemorrhoids found on perianal exam. Three 3 to 4 mm polyps in the ascending colon. Resected and retrieved. One 3 mm polyp in the sigmoid  colon. Resected and retrieved. Pathology found to be tubular adenoma. Diverticulosis in the sigmoid colon.  Non- bleeding external and internal hemorrhoids. The examined portion of the ileum was normal.    Annual Wellness Visit done today including the all of the following: Reviewed patient's Family Medical History Reviewed patient's SDOH and reviewed tobacco, alcohol, and drug use.  Reviewed and updated list of patient's medical providers Assessment of cognitive impairment was done Assessed patient's functional ability Established a written schedule for health screening services Health Risk Assessent Completed and Reviewed  Discussed health benefits of physical activity, and encouraged her to engage in regular exercise appropriate for her age and condition.   Plan: Return in one year or as needed  I,Makayla C Reid,acting as a scribe for Ronal JINNY Hailstone, MD.,have documented all relevant documentation on the behalf of Ronal JINNY Hailstone, MD,as directed by  Ronal JINNY Hailstone, MD while in the presence of Ronal JINNY Hailstone, MD.  I, Ronal JINNY Hailstone, MD, have reviewed all documentation for and agree with the above Annual Wellness Visit documentation.  Ronal JINNY Hailstone, MD Internal Medicine 07/02/2024

## 2024-07-02 ENCOUNTER — Ambulatory Visit (INDEPENDENT_AMBULATORY_CARE_PROVIDER_SITE_OTHER): Payer: Medicare Other | Admitting: Internal Medicine

## 2024-07-02 VITALS — BP 120/80 | HR 88 | Ht 66.0 in | Wt 195.0 lb

## 2024-07-02 DIAGNOSIS — Z8639 Personal history of other endocrine, nutritional and metabolic disease: Secondary | ICD-10-CM

## 2024-07-02 DIAGNOSIS — Z0000 Encounter for general adult medical examination without abnormal findings: Secondary | ICD-10-CM | POA: Diagnosis not present

## 2024-07-02 DIAGNOSIS — Z780 Asymptomatic menopausal state: Secondary | ICD-10-CM

## 2024-07-02 DIAGNOSIS — E7800 Pure hypercholesterolemia, unspecified: Secondary | ICD-10-CM | POA: Diagnosis not present

## 2024-07-02 DIAGNOSIS — H1031 Unspecified acute conjunctivitis, right eye: Secondary | ICD-10-CM

## 2024-07-02 DIAGNOSIS — N6452 Nipple discharge: Secondary | ICD-10-CM | POA: Diagnosis not present

## 2024-07-02 DIAGNOSIS — Z8669 Personal history of other diseases of the nervous system and sense organs: Secondary | ICD-10-CM

## 2024-07-02 DIAGNOSIS — Z803 Family history of malignant neoplasm of breast: Secondary | ICD-10-CM

## 2024-07-02 DIAGNOSIS — R928 Other abnormal and inconclusive findings on diagnostic imaging of breast: Secondary | ICD-10-CM | POA: Diagnosis not present

## 2024-07-02 LAB — POCT URINALYSIS DIP (CLINITEK)
Bilirubin, UA: NEGATIVE
Blood, UA: NEGATIVE
Glucose, UA: NEGATIVE mg/dL
Ketones, POC UA: NEGATIVE mg/dL
Leukocytes, UA: NEGATIVE
Nitrite, UA: NEGATIVE
POC PROTEIN,UA: NEGATIVE
Spec Grav, UA: 1.01 (ref 1.010–1.025)
Urobilinogen, UA: 0.2 U/dL
pH, UA: 6.5 (ref 5.0–8.0)

## 2024-07-02 MED ORDER — OFLOXACIN 0.3 % OP SOLN: OPHTHALMIC | 0 refills | Status: DC

## 2024-07-02 NOTE — Addendum Note (Signed)
 Addended by: PERRI RONAL PARAS on: 07/02/2024 03:07 PM   Modules accepted: Level of Service

## 2024-07-02 NOTE — Progress Notes (Signed)
 Subjective:   Carla Collins is a 73 y.o. female who presents for Medicare Annual (Subsequent) preventive examination.  Visit Complete: In person  Patient Medicare AWV questionnaire was completed by the patient on 07/02/2024; I have confirmed that all information answered by patient is correct and no changes since this date.  Cardiac Risk Factors include: advanced age (>45men, >5 women);dyslipidemia     Objective:    Today's Vitals   07/02/24 1052  BP: 120/80  Pulse: 88  SpO2: 98%  Weight: 195 lb (88.5 kg)  Height: 5' 6 (1.676 m)   Body mass index is 31.47 kg/m.     06/21/2022    2:04 PM 05/05/2020   11:09 AM  Advanced Directives  Does Patient Have a Medical Advance Directive? Yes Yes  Type of Estate Agent of [place];Living will Healthcare Power of [place];Living will  Does patient want to make changes to medical advance directive? No - Patient declined   Copy of Healthcare Power of Attorney in Chart? Yes - validated most recent copy scanned in chart (See row information)     Current Medications (verified) Outpatient Encounter Medications as of 07/02/2024  Medication Sig   ofloxacin  (OCUFLOX ) 0.3 % ophthalmic solution 2 drops in right eye 4 times a day for 5-7 days   Cholecalciferol (VITAMIN D3) 25 MCG (1000 UT) CAPS Take by mouth.   HYDROcodone  bit-homatropine (HYCODAN) 5-1.5 MG/5ML syrup Take 5 mLs by mouth every 8 (eight) hours as needed for cough.   Multiple Vitamin (MULTIVITAMIN) capsule Take 1 capsule by mouth daily.   rizatriptan  (MAXALT ) 10 MG tablet Take 1 tablet (10 mg total) by mouth as needed for migraine. May repeat in 2 hours if needed   rosuvastatin  (CRESTOR ) 10 MG tablet TAKE 1 TABLET EVERY DAY AT SUPPER   No facility-administered encounter medications on file as of 07/02/2024.    Allergies (verified) Sulfa antibiotics   History: Past Medical History:  Diagnosis Date   Cervical dysplasia    History of migraines     Hyperlipidemia    Urticaria    Vitamin D  deficiency    Past Surgical History:  Procedure Laterality Date   ABDOMINAL HYSTERECTOMY     COLONOSCOPY     Family History  Problem Relation Age of Onset   Cancer Father    Colon cancer Neg Hx    Esophageal cancer Neg Hx    Rectal cancer Neg Hx    Stomach cancer Neg Hx    Colon polyps Neg Hx    Social History   Socioeconomic History   Marital status: Married    Spouse name: Not on file   Number of children: Not on file   Years of education: Not on file   Highest education level: Master's degree (e.g., MA, MS, MEng, MEd, MSW, MBA)  Occupational History   Not on file  Tobacco Use   Smoking status: Never   Smokeless tobacco: Never  Vaping Use   Vaping status: Never Used  Substance and Sexual Activity   Alcohol use: No   Drug use: No   Sexual activity: Not on file  Other Topics Concern   Not on file  Social History Narrative   Right handed   Social Drivers of Health   Financial Resource Strain: Low Risk  (07/02/2024)   Overall Financial Resource Strain (CARDIA)    Difficulty of Paying Living Expenses: Not hard at all  Food Insecurity: No Food Insecurity (07/02/2024)   Hunger Vital Sign  Worried About Programme Researcher, Broadcasting/film/video in the Last Year: Never true    Ran Out of Food in the Last Year: Never true  Transportation Needs: No Transportation Needs (07/02/2024)   PRAPARE - Administrator, Civil Service (Medical): No    Lack of Transportation (Non-Medical): No  Physical Activity: Insufficiently Active (07/02/2024)   Exercise Vital Sign    Days of Exercise per Week: 2 days    Minutes of Exercise per Session: 10 min  Stress: No Stress Concern Present (07/02/2024)   [employer] of Occupational Health - Occupational Stress Questionnaire    Feeling of Stress: Not at all  Social Connections: Socially Integrated (07/02/2024)   Social Connection and Isolation Panel    Frequency of Communication with Friends and  Family: More than three times a week    Frequency of Social Gatherings with Friends and Family: More than three times a week    Attends Religious Services: More than 4 times per year    Active Member of [employer] or Organizations: Yes    Attends Engineer, Structural: More than 4 times per year    Marital Status: Married    Tobacco Counseling Counseling given: Not Answered   Clinical Intake:                        Activities of Daily Living    07/02/2024   10:44 AM 07/02/2024    9:21 AM  In your present state of health, do you have any difficulty performing the following activities:  Hearing? 0 0  Vision? 0 0  Difficulty concentrating or making decisions? 0 0  Walking or climbing stairs? 0 0  Dressing or bathing? 0 0  Doing errands, shopping? 0 0  Preparing Food and eating ? N N  Using the Toilet? N N  In the past six months, have you accidently leaked urine? N N  Do you have problems with loss of bowel control? N N  Managing your Medications? N N  Managing your Finances? N N  Housekeeping or managing your Housekeeping? N N    Patient Care Team: Perri Ronal PARAS, MD as PCP - General (Internal Medicine)  Indicate any recent Medical Services you may have received from other than [facility] providers in the past year (date may be approximate).     Assessment:   This is a routine wellness examination for Carla Collins.  Hearing/Vision screen No results found.   Goals Addressed   None    Depression Screen    09/18/2023    4:24 PM 06/21/2022    2:03 PM 06/08/2021    2:14 PM 05/15/2020   11:06 AM 11/07/2019   10:18 AM 05/03/2019   11:00 AM 01/16/2018   10:29 AM  PHQ 2/9 Scores  PHQ - 2 Score 0 0 0 0 0 0 0    Fall Risk    07/02/2024   10:45 AM 07/02/2024    9:21 AM 06/30/2023   10:03 AM 06/21/2022    2:03 PM 06/08/2021    2:17 PM  Fall Risk   Falls in the past year? 0 0 0 0 0  Number falls in past yr: 0  0 0 0  Injury with Fall? 0 0 0 0 0  Risk for  fall due to : No Fall Risks  No Fall Risks No Fall Risks No Fall Risks  Follow up Education provided;Falls evaluation completed;Falls prevention discussed  Falls evaluation completed Falls evaluation  completed  Falls evaluation completed      Data saved with a previous flowsheet row definition    MEDICARE RISK AT HOME: Medicare Risk at Home Any stairs in or around the home?: Yes If so, are there any without handrails?: Yes Home free of loose throw rugs in walkways, pet beds, electrical cords, etc?: Yes Adequate lighting in your home to reduce risk of falls?: Yes Life alert?: No Use of a cane, walker or w/c?: No Grab bars in the bathroom?: No Shower chair or bench in shower?: No Elevated toilet seat or a handicapped toilet?: No  TIMED UP AND GO:  Was the test performed?  No    Cognitive Function:        07/02/2024   10:52 AM 06/30/2023   10:05 AM 06/21/2022    2:05 PM 06/08/2021    2:13 PM  6CIT Screen  What Year? 0 points 0 points 0 points 0 points  What month? 0 points 0 points 0 points 0 points  What time? 0 points 0 points 0 points 0 points  Count back from 20 0 points 0 points 0 points 0 points  Months in reverse 0 points 0 points 0 points 0 points  Repeat phrase 0 points 0 points 0 points 0 points  Total Score 0 points 0 points 0 points 0 points    Immunizations Immunization History  Administered Date(s) Administered   Fluad Trivalent(High Dose 65+) 06/07/2024   INFLUENZA, HIGH DOSE SEASONAL PF 07/03/2018, 05/19/2020, 05/19/2021   Influenza Inj Mdck Quad Pf 06/26/2017   Influenza Split 06/15/2013   Influenza,inj,Quad PF,6+ Mos 05/03/2019, 06/21/2022   Influenza-Unspecified 10/11/2014, 07/15/2015, 07/15/2016   PFIZER Comirnaty(Gray Top)Covid-19 Tri-Sucrose Vaccine 12/03/2020   PFIZER(Purple Top)SARS-COV-2 Vaccination 09/30/2019, 10/21/2019, 05/10/2020   Pfizer Covid-19 Vaccine Bivalent Booster 6yrs & up 05/13/2021   Pneumococcal Conjugate-13 01/16/2018    Pneumococcal Polysaccharide-23 05/24/2019   Tdap 10/25/2002, 04/17/2015   Unspecified SARS-COV-2 Vaccination 06/06/2022   Zoster Recombinant(Shingrix) 06/21/2021, 10/04/2021    TDAP status: Up to date  Flu Vaccine status: Up to date  Pneumococcal vaccine status: Up to date  Covid-19 vaccine status: Information provided on how to obtain vaccines.   Qualifies for Shingles Vaccine? Yes   Zostavax completed No   Shingrix Completed?: Yes  Screening Tests Health Maintenance  Topic Date Due   COVID-19 Vaccine (7 - 2025-26 season) 05/06/2024   Hepatitis C Screening  07/02/2025 (Originally 08/11/1969)   DTaP/Tdap/Td (3 - Td or Tdap) 04/16/2025   Mammogram  06/20/2025   Medicare Annual Wellness (AWV)  07/02/2025   Colonoscopy  10/10/2028   Pneumococcal Vaccine: 50+ Years  Completed   Influenza Vaccine  Completed   DEXA SCAN  Completed   Zoster Vaccines- Shingrix  Completed   Meningococcal B Vaccine  Aged Out    Health Maintenance  Health Maintenance Due  Topic Date Due   COVID-19 Vaccine (7 - 2025-26 season) 05/06/2024    Colorectal cancer screening: Type of screening: Colonoscopy. Completed 10/11/2023. Repeat every 5 years  Mammogram status: Completed 06/20/2024. Repeat every year  Bone Density status: Completed 06/26/2024. Results reflect: Bone density results: OSTEOPENIA. Repeat every 2 years.  Lung Cancer Screening: (Low Dose CT Chest recommended if Age 35-80 years, 20 pack-year currently smoking OR have quit w/in 15years.) does not qualify.   Additional Screening:  Hepatitis C Screening: does not qualify; Completed   Vision Screening: Recommended annual ophthalmology exams for early detection of glaucoma and other disorders of the eye. Is the patient up  to date with their annual eye exam?  Yes  Who is the provider or what is the name of the office in which the patient attends annual eye exams? [facility] If pt is not established with a provider, would  they like to be referred to a provider to establish care? No .   Dental Screening: Recommended annual dental exams for proper oral hygiene  Community Resource Referral / Chronic Care Management: CRR required this visit?  No   CCM required this visit?  No     Plan:     I have personally reviewed and noted the following in the patient's chart:   Medical and social history Use of alcohol, tobacco or illicit drugs  Current medications and supplements including opioid prescriptions. Patient is not currently taking opioid prescriptions. Functional ability and status Nutritional status Physical activity Advanced directives List of other physicians Hospitalizations, surgeries, and ER visits in previous 12 months Vitals Screenings to include cognitive, depression, and falls Referrals and appointments  In addition, I have reviewed and discussed with patient certain preventive protocols, quality metrics, and best practice recommendations. A written personalized care plan for preventive services as well as general preventive health recommendations were provided to patient.     Brilynn Biasi Zelda, CMA   07/02/2024   After Visit Summary: (In Person-Printed) AVS printed and given to the patient  I, Ronal JINNY Hailstone, MD, have reviewed all documentation for this visit. The documentation on 07/02/2024 for the exam, diagnosis, procedures, and orders are all accurate and complete.

## 2024-07-02 NOTE — Patient Instructions (Addendum)
 Next appointment: Follow up in one year for your annual wellness visit    Preventive Care 65 Years and Older, Female Preventive care refers to lifestyle choices and visits with your health care provider that can promote health and wellness. What does preventive care include? A yearly physical exam. This is also called an annual well check. Dental exams once or twice a year. Routine eye exams. Ask your health care provider how often you should have your eyes checked. Personal lifestyle choices, including: Daily care of your teeth and gums. Regular physical activity. Eating a healthy diet. Avoiding tobacco and drug use. Limiting alcohol use. Practicing safe sex. Taking low-dose aspirin every day. Taking vitamin and mineral supplements as recommended by your health care provider. What happens during an annual well check? The services and screenings done by your health care provider during your annual well check will depend on your age, overall health, lifestyle risk factors, and family history of disease. Counseling  Your health care provider may ask you questions about your: Alcohol use. Tobacco use. Drug use. Emotional well-being. Home and relationship well-being. Sexual activity. Eating habits. History of falls. Memory and ability to understand (cognition). Work and work astronomer. Reproductive health. Screening  You may have the following tests or measurements: Height, weight, and BMI. Blood pressure. Lipid and cholesterol levels. These may be checked every 5 years, or more frequently if you are over 72 years old. Skin check. Lung cancer screening. You may have this screening every year starting at age 72 if you have a 30-pack-year history of smoking and currently smoke or have quit within the past 15 years. Fecal occult blood test (FOBT) of the stool. You may have this test every year starting at age 72. Flexible sigmoidoscopy or colonoscopy. You may have a sigmoidoscopy  every 5 years or a colonoscopy every 10 years starting at age 72. Hepatitis C blood test. Hepatitis B blood test. Sexually transmitted disease (STD) testing. Diabetes screening. This is done by checking your blood sugar (glucose) after you have not eaten for a while (fasting). You may have this done every 1-3 years. Bone density scan. This is done to screen for osteoporosis. You may have this done starting at age 72. Mammogram. This may be done every 1-2 years. Talk to your health care provider about how often you should have regular mammograms. Talk with your health care provider about your test results, treatment options, and if necessary, the need for more tests. Vaccines  Your health care provider may recommend certain vaccines, such as: Influenza vaccine. This is recommended every year. Tetanus, diphtheria, and acellular pertussis (Tdap, Td) vaccine. You may need a Td booster every 10 years. Zoster vaccine. You may need this after age 65. Pneumococcal 13-valent conjugate (PCV13) vaccine. One dose is recommended after age 62. Pneumococcal polysaccharide (PPSV23) vaccine. One dose is recommended after age 3. Talk to your health care provider about which screenings and vaccines you need and how often you need them. This information is not intended to replace advice given to you by your health care provider. Make sure you discuss any questions you have with your health care provider. Document Released: 09/18/2015 Document Revised: 05/11/2016 Document Reviewed: 06/23/2015 Elsevier Interactive Patient Education  2017 [employer].  Fall Prevention in the Home Falls can cause injuries. They can happen to people of all ages. There are many things you can do to make your home safe and to help prevent falls. What can I do on the outside of my  home? Regularly fix the edges of walkways and driveways and fix any cracks. Remove anything that might make you trip as you walk through a door, such as a  raised step or threshold. Trim any bushes or trees on the path to your home. Use bright outdoor lighting. Clear any walking paths of anything that might make someone trip, such as rocks or tools. Regularly check to see if handrails are loose or broken. Make sure that both sides of any steps have handrails. Any raised decks and porches should have guardrails on the edges. Have any leaves, snow, or ice cleared regularly. Use sand or salt on walking paths during winter. Clean up any spills in your garage right away. This includes oil or grease spills. What can I do in the bathroom? Use night lights. Install grab bars by the toilet and in the tub and shower. Do not use towel bars as grab bars. Use non-skid mats or decals in the tub or shower. If you need to sit down in the shower, use a plastic, non-slip stool. Keep the floor dry. Clean up any water that spills on the floor as soon as it happens. Remove soap buildup in the tub or shower regularly. Attach bath mats securely with double-sided non-slip rug tape. Do not have throw rugs and other things on the floor that can make you trip. What can I do in the bedroom? Use night lights. Make sure that you have a light by your bed that is easy to reach. Do not use any sheets or blankets that are too big for your bed. They should not hang down onto the floor. Have a firm chair that has side arms. You can use this for support while you get dressed. Do not have throw rugs and other things on the floor that can make you trip. What can I do in the kitchen? Clean up any spills right away. Avoid walking on wet floors. Keep items that you use a lot in easy-to-reach places. If you need to reach something above you, use a strong step stool that has a grab bar. Keep electrical cords out of the way. Do not use floor polish or wax that makes floors slippery. If you must use wax, use non-skid floor wax. Do not have throw rugs and other things on the floor  that can make you trip. What can I do with my stairs? Do not leave any items on the stairs. Make sure that there are handrails on both sides of the stairs and use them. Fix handrails that are broken or loose. Make sure that handrails are as long as the stairways. Check any carpeting to make sure that it is firmly attached to the stairs. Fix any carpet that is loose or worn. Avoid having throw rugs at the top or bottom of the stairs. If you do have throw rugs, attach them to the floor with carpet tape. Make sure that you have a light switch at the top of the stairs and the bottom of the stairs. If you do not have them, ask someone to add them for you. What else can I do to help prevent falls? Wear shoes that: Do not have high heels. Have rubber bottoms. Are comfortable and fit you well. Are closed at the toe. Do not wear sandals. If you use a stepladder: Make sure that it is fully opened. Do not climb a closed stepladder. Make sure that both sides of the stepladder are locked into place. Ask someone  to hold it for you, if possible. Clearly mark and make sure that you can see: Any grab bars or handrails. First and last steps. Where the edge of each step is. Use tools that help you move around (mobility aids) if they are needed. These include: Canes. Walkers. Scooters. Crutches. Turn on the lights when you go into a dark area. Replace any light bulbs as soon as they burn out. Set up your furniture so you have a clear path. Avoid moving your furniture around. If any of your floors are uneven, fix them. If there are any pets around you, be aware of where they are. Review your medicines with your doctor. Some medicines can make you feel dizzy. This can increase your chance of falling. Ask your doctor what other things that you can do to help prevent falls. This information is not intended to replace advice given to you by your health care provider. Make sure you discuss any questions you  have with your health care provider. Document Released: 06/18/2009 Document Revised: 01/28/2016 Document Reviewed: 09/26/2014 Elsevier Interactive Patient Education  2017 Elsevier Inc[employer]   Carla Collins , Thank you for taking time to come for your Medicare Wellness Visit. I appreciate your ongoing commitment to your health goals. Please review the following plan we discussed and let me know if I can assist you in the future.   These are the goals we discussed:  Goals   None     This is a list of the screening recommended for you and due dates:  Health Maintenance  Topic Date Due   COVID-19 Vaccine (7 - 2025-26 season) 05/06/2024   Hepatitis C Screening  07/02/2025*   DTaP/Tdap/Td vaccine (3 - Td or Tdap) 04/16/2025   Breast Cancer Screening  06/20/2025   Medicare Annual Wellness Visit  07/02/2025   Colon Cancer Screening  10/10/2028   Pneumococcal Vaccine for age over 89  Completed   Flu Shot  Completed   DEXA scan (bone density measurement)  Completed   Zoster (Shingles) Vaccine  Completed   Meningitis B Vaccine  Aged Out  *Topic was postponed. The date shown is not the original due date.    Patient is having right breast biopsy today for abnormal mammogram at [facility]. She has conjunctivitis right eye and will be prescribed Oculflox ophthalmic solution to use 2 drops in right eye 4 times a day for 5 days. Throw out old eye makeup and mascara. Labs are stable. Vaccines discussed.

## 2024-07-05 ENCOUNTER — Encounter: Payer: Self-pay | Admitting: Internal Medicine

## 2024-07-09 ENCOUNTER — Other Ambulatory Visit: Payer: Self-pay

## 2024-07-09 ENCOUNTER — Ambulatory Visit: Payer: Self-pay

## 2024-07-09 MED ORDER — OFLOXACIN 0.3 % OP SOLN: OPHTHALMIC | 0 refills | Status: DC

## 2024-07-09 MED ORDER — OFLOXACIN 0.3 % OP SOLN: OPHTHALMIC | 0 refills | Status: AC

## 2024-07-09 NOTE — Telephone Encounter (Signed)
 Drops are almost gone from previous right eye  Left eye is now becoming symptomatic, itchy/red/crusty in the am. Was seen recently for the same symptoms in the right eye.  Medication: ofloxacin  (OCUFLOX ) 0.3 % ophthalmic solution  This is the patient's preferred pharmacy: [id number] - [place], [place] - [address] [place] [address] Phone: [phone] Fax: [phone]  Best contact: [phone]  FYI Only or Action Required?: Action required by provider: update on patient condition and patient requesting antibiotic for left eye at this time.  Patient was last seen in primary care on 07/02/2024 by Perri Ronal PARAS, MD.  Called Nurse Triage reporting Conjunctivitis.  Symptoms began last night.  Interventions attempted: Rest, hydration, or home remedies and Other: warm washcloth.  Symptoms are: gradually worsening.  Triage Disposition: Call PCP Within 24 Hours  Patient/caregiver understands and will follow disposition?: Yes           Summary: Left eye symptoms, seeking Rx   Reason for Triage: Left eye is now becoming symptomatic, itchy/red/crusty in the am. Was seen recently for the same symptoms in the right eye.  Medication: ofloxacin  (OCUFLOX ) 0.3 % ophthalmic solution  This is the patient's preferred pharmacy: [id number] - [place], [place] - [address] [place] [address] Phone: [phone] Fax: [phone]  Best contact: [phone]     Reason for Disposition  [1] Bacterial conjunctivitis suspected (e.g., white, yellow or green discharge nearly all day long; or eyelids stuck shut from discharge after sleep) AND [2] NO doctor standing order to call in antibiotic eye drops  (Exception: [place]; continue triage.)  Answer Assessment - Initial Assessment Questions Patient was seen in the office on 07/02/2024 and  received antibiotic eye drops for her right eye having acute bacterial conjunctivitis and those drops are almost gone Patient states that last night her left eye started to feel itchy and become red and have sticky crusty drainage just as the right eye did Patient requesting a refill of the antibiotic eye drops to use in her left eye at this time She denies any other symptoms and states this is the exact same as her right eye when she was just seen in the office on 07/02/2024 and prescribed ofloxacin .  Patient is advised to call us  back if anything changes or with any further questions/concerns. Patient is advised that if anything worsens to go to the Emergency Room. Patient verbalized understanding.    1. EYE DISCHARGE: Is the discharge in one or both eyes? What color is it? How much is there? When did the discharge start?      Last night in the left eye 2. REDNESS OF SCLERA: Is there redness in the white of the eye? If Yes, ask: Is it in one or both eyes? When did the redness start?     Red and crusty/sticky 3. EYELIDS: Are the eyelids red or swollen? If Yes, ask: How much?      No just a little puffiness under the eye but that has  4. VISION: Do you have blurred vision?     no 5. PAIN: Is there any pain? If Yes, ask: How bad is the pain? (Scale 0-10; or none, mild, moderate, severe)     Just scratchy and itchy and crusty & red 6. CONTACT LENS: Do you wear contacts?     no 7. OTHER  SYMPTOMS: Do you have any other symptoms? (e.g., fever, runny nose, cough)     denies  Protocols used: Eye - Pus or Discharge-A-AH

## 2024-07-09 NOTE — Addendum Note (Signed)
 Addended by: Sharalyn Lomba P on: 07/09/2024 04:19 PM   Modules accepted: Orders

## 2024-07-22 ENCOUNTER — Encounter: Payer: Self-pay | Admitting: Internal Medicine

## 2024-09-24 ENCOUNTER — Ambulatory Visit: Admitting: Internal Medicine

## 2024-09-24 ENCOUNTER — Ambulatory Visit: Payer: Self-pay

## 2024-09-24 ENCOUNTER — Encounter: Payer: Self-pay | Admitting: Internal Medicine

## 2024-09-24 VITALS — BP 120/80 | HR 84 | Ht 66.0 in

## 2024-09-24 DIAGNOSIS — M1711 Unilateral primary osteoarthritis, right knee: Secondary | ICD-10-CM

## 2024-09-24 MED ORDER — MELOXICAM 15 MG PO TABS: 15.0000 mg | ORAL_TABLET | Freq: Every day | ORAL | 2 refills | Status: AC

## 2024-09-24 NOTE — Progress Notes (Signed)
 "   Patient Care Team: Perri Ronal PARAS, MD as PCP - General (Internal Medicine)  Visit Date: 09/24/24  Subjective:    Patient ID: Carla Collins , Female   DOB: 12/23/1950, 74 y.o.    MRN: [id number]   74 y.o. Female presents today for Right  Knee pain. Patient has a past medical history of cervical dysplasia, migraine, headaches, urticaria, vitamin D  deficiency.  She has been experiencing right knee pain. She said it has been bothering her for about a year but the pain has worsened in the past 6 months. She said that it has been affecting her ability to walk and go up stairs. She has been using aleve, ibuprofen and putting icy hot on the area. She said that it causes the pain to subside but it eventually returns. Physical exam revealed decreased ability to flex knee. No crepitus or popliteal cysts. Not swollen, red, or hot to touch. 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   Past Medical History:  Diagnosis Date   Cervical dysplasia    History of migraines    Hyperlipidemia    Urticaria    Vitamin D  deficiency      Family History  Problem Relation Age of Onset   Cancer Father    Colon cancer Neg Hx    Esophageal cancer Neg Hx    Rectal cancer Neg Hx    Stomach cancer Neg Hx    Colon polyps Neg Hx     Social History   Social History Narrative   Right handed   Married.Has a Masters degree. Retired from [employer]. Does not smoke or consume alcohol. One daughter and one grandchild.    Review of Systems  Musculoskeletal:  Positive for joint pain.        Objective:   Vitals: BP 120/80   Pulse 84   Ht 5' 6 (1.676 m)   SpO2 96%   BMI 31.47 kg/m   Decreased ROM with  attempted flexion of the right knee   No effusion present. No increased warmth of knee. No significant crepitus noted.  Results:   Studies obtained and personally reviewed by me:  Labs:       Component Value Date/Time   NA 141 06/25/2024 1025   K 4.7 06/25/2024 1025    CL 106 06/25/2024 1025   CO2 26 06/25/2024 1025   GLUCOSE 88 06/25/2024 1025   BUN 16 06/25/2024 1025   CREATININE 0.86 06/25/2024 1025   CALCIUM  9.6 06/25/2024 1025   PROT 7.6 06/25/2024 1025   ALBUMIN 3.7 10/25/2016 1029   AST 20 06/25/2024 1025   ALT 13 06/25/2024 1025   ALKPHOS 105 10/25/2016 1029   BILITOT 0.8 06/25/2024 1025   GFRNONAA 64 05/14/2020 1127   GFRAA 74 05/14/2020 1127     Lab Results  Component Value Date   WBC 6.6 06/25/2024   HGB 15.3 06/25/2024   HCT 47.3 (H) 06/25/2024   MCV 92.9 06/25/2024   PLT 259 06/25/2024    Lab Results  Component Value Date   CHOL 165 06/25/2024   HDL 65 06/25/2024   LDLCALC 80 06/25/2024   TRIG 104 06/25/2024   CHOLHDL 2.5 06/25/2024    Lab Results  Component Value Date   HGBA1C 5.9 (H) 12/30/2013     Lab Results  Component Value Date   TSH 2.77 06/25/2024        Assessment & Plan:   Orders Placed This Encounter  Procedures   AMB referral to orthopedics    Referral Priority:   Routine    Referral Type:   Consultation    Referred to Provider:   Melodi Lerner, MD    Requested Specialty:   Orthopedic Surgery    Number of Visits Requested:   1   Meds ordered this encounter  Medications   meloxicam  (MOBIC ) 15 MG tablet    Sig: Take 1 tablet (15 mg total) by mouth daily.    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 She said it has been an issue her for about a year but the pain has worsened over the past 6 months. She said that it has been affecting her ability to walk and go up stairs. She has been using Aleve, Ibuprofen and putting  Icy hot on the area. She said that OTC meds help the pain to subside but pain eventually returns. Physical exam revealed decreased ability to flex knee to full extent. No crepitus or popliteal cyst on exam. Not swollen, red, or hot to touch. 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   Referred to Orthopedics.   Meloxicam  15 mg daily prescribed. Take with food  I,Makayla C Reid,acting as a scribe for Ronal JINNY Hailstone, MD.,have documented all relevant documentation on the behalf of Ronal JINNY Hailstone, MD,as directed by  Ronal JINNY Hailstone, MD while in the presence of Ronal JINNY Hailstone, MD.  I, Ronal JINNY Hailstone, MD, have reviewed all documentation for this visit. The documentation on 09/24/2024 for the exam, diagnosis, procedures, and orders are all accurate and complete.     "

## 2024-09-24 NOTE — Patient Instructions (Addendum)
 Referral to [facility], Dr. Dempsey Lawn for evaluation of right knee pain, likely osteoarthritis. Start Meloxicam  15mg  daily with a meal.

## 2024-09-24 NOTE — Telephone Encounter (Signed)
 FYI Only or Action Required?: FYI only for provider: appointment scheduled on 1/20.  Patient was last seen in primary care on 07/02/2024 by Perri Ronal PARAS, MD.  Called Nurse Triage reporting Knee Pain.  Symptoms began several months ago.  Interventions attempted: OTC medications: ibuprofen.  Symptoms are: gradually worsening.  Triage Disposition: See PCP Within 2 Weeks  Patient/caregiver understands and will follow disposition?: Yes  Reason for Triage: pt exp worsening knee issues  Reason for Disposition  Knee pain is a chronic symptom (recurrent or ongoing AND present > 4 weeks)  Answer Assessment - Initial Assessment Questions 1. LOCATION and RADIATION: Where is the pain located?      R knee  2. QUALITY: What does the pain feel like?  (e.g., sharp, dull, aching, burning)     Pain when she bends it, it hurts  3. SEVERITY: How bad is the pain? What does it keep you from doing?   (Scale 1-10; or mild, moderate, severe)     Taking ibuprofen, mineral ice, arthritis rub, 8/10   4. ONSET: When did the pain start? Does it come and go, or is it there all the time?     6 months, worse since November  5. RECURRENT: Have you had this pain before? If Yes, ask: When, and what happened then?     Yes for the last 6 months  7. AGGRAVATING FACTORS: What makes the knee pain worse? (e.g., walking, climbing stairs, running)     Bending  Protocols used: Knee Pain-A-AH

## 2024-09-24 NOTE — Progress Notes (Incomplete)
" ° ° °  Patient Care Team: Perri Ronal PARAS, MD as PCP - General (Internal Medicine)  Visit Date: 09/24/24  Subjective:    Patient ID: Carla Collins , Female   DOB: [birth year], 74 y.o.    MRN: [id number]   74 y.o. Female presents today for Knee pain. Patient has a past medical history of ***.  ***   Past Medical History:  Diagnosis Date   Cervical dysplasia    History of migraines    Hyperlipidemia    Urticaria    Vitamin D  deficiency      Family History  Problem Relation Age of Onset   Cancer Father    Colon cancer Neg Hx    Esophageal cancer Neg Hx    Rectal cancer Neg Hx    Stomach cancer Neg Hx    Colon polyps Neg Hx     Social History   Social History Narrative   Right handed      ROS      Objective:   Vitals: There were no vitals taken for this visit.   Physical Exam    Results:   Studies obtained and personally reviewed by me:  Imaging, colonoscopy, mammogram, bone density scan, echocardiogram, heart cath, stress test, CT calcium  score, etc. ***   Labs:       Component Value Date/Time   NA 141 06/25/2024 1025   K 4.7 06/25/2024 1025   CL 106 06/25/2024 1025   CO2 26 06/25/2024 1025   GLUCOSE 88 06/25/2024 1025   BUN 16 06/25/2024 1025   CREATININE 0.86 06/25/2024 1025   CALCIUM  9.6 06/25/2024 1025   PROT 7.6 06/25/2024 1025   ALBUMIN 3.7 10/25/2016 1029   AST 20 06/25/2024 1025   ALT 13 06/25/2024 1025   ALKPHOS 105 10/25/2016 1029   BILITOT 0.8 06/25/2024 1025   GFRNONAA 64 05/14/2020 1127   GFRAA 74 05/14/2020 1127     Lab Results  Component Value Date   WBC 6.6 06/25/2024   HGB 15.3 06/25/2024   HCT 47.3 (H) 06/25/2024   MCV 92.9 06/25/2024   PLT 259 06/25/2024    Lab Results  Component Value Date   CHOL 165 06/25/2024   HDL 65 06/25/2024   LDLCALC 80 06/25/2024   TRIG 104 06/25/2024   CHOLHDL 2.5 06/25/2024    Lab Results  Component Value Date   HGBA1C 5.9 (H) 12/30/2013     Lab Results  Component Value  Date   TSH 2.77 06/25/2024     No results found for: PSA1, PSA *** delete for female pts  ***    Assessment & Plan:   ***    I,Makayla C Reid,acting as a scribe for Ronal PARAS Perri, MD.,have documented all relevant documentation on the behalf of Ronal PARAS Perri, MD,as directed by  Ronal PARAS Perri, MD while in the presence of Ronal PARAS Perri, MD.   ***   "
